# Patient Record
Sex: Female | Born: 1955
Health system: Southern US, Community
[De-identification: ages and names within clinical notes are randomized; demographics above are authoritative.]

## PROBLEM LIST (undated history)

## (undated) DIAGNOSIS — F329 Major depressive disorder, single episode, unspecified: Secondary | ICD-10-CM

## (undated) DIAGNOSIS — I10 Essential (primary) hypertension: Secondary | ICD-10-CM

## (undated) DIAGNOSIS — M1990 Unspecified osteoarthritis, unspecified site: Secondary | ICD-10-CM

## (undated) DIAGNOSIS — F32A Depression, unspecified: Secondary | ICD-10-CM

## (undated) HISTORY — PX: HAND SURGERY: SHX662

## (undated) HISTORY — PX: TUBAL LIGATION: SHX77

## (undated) HISTORY — PX: WISDOM TOOTH EXTRACTION: SHX21

## (undated) HISTORY — PX: MINI-LAPAROTOMY: SHX2035

## (undated) HISTORY — PX: TENDON RELEASE: SHX230

---

## 1997-11-23 ENCOUNTER — Emergency Department (HOSPITAL_COMMUNITY): Admission: EM | Admit: 1997-11-23 | Discharge: 1997-11-23 | Payer: Self-pay | Admitting: Emergency Medicine

## 2000-05-18 ENCOUNTER — Ambulatory Visit (HOSPITAL_COMMUNITY): Admission: RE | Admit: 2000-05-18 | Discharge: 2000-05-18 | Payer: Self-pay | Admitting: *Deleted

## 2001-06-23 ENCOUNTER — Emergency Department (HOSPITAL_COMMUNITY): Admission: EM | Admit: 2001-06-23 | Discharge: 2001-06-23 | Payer: Self-pay | Admitting: Emergency Medicine

## 2001-06-23 ENCOUNTER — Encounter: Payer: Self-pay | Admitting: Emergency Medicine

## 2001-07-03 ENCOUNTER — Encounter: Payer: Self-pay | Admitting: Orthopedic Surgery

## 2001-07-03 ENCOUNTER — Encounter: Admission: RE | Admit: 2001-07-03 | Discharge: 2001-07-03 | Payer: Self-pay | Admitting: Orthopedic Surgery

## 2002-03-13 ENCOUNTER — Encounter: Admission: RE | Admit: 2002-03-13 | Discharge: 2002-03-13 | Payer: Self-pay | Admitting: Family Medicine

## 2002-03-13 ENCOUNTER — Encounter: Payer: Self-pay | Admitting: Family Medicine

## 2002-09-26 ENCOUNTER — Encounter: Admission: RE | Admit: 2002-09-26 | Discharge: 2002-09-26 | Payer: Self-pay | Admitting: Family Medicine

## 2002-09-26 ENCOUNTER — Encounter: Payer: Self-pay | Admitting: Family Medicine

## 2002-10-01 ENCOUNTER — Ambulatory Visit (HOSPITAL_COMMUNITY): Admission: RE | Admit: 2002-10-01 | Discharge: 2002-10-01 | Payer: Self-pay | Admitting: Family Medicine

## 2002-10-01 ENCOUNTER — Encounter: Payer: Self-pay | Admitting: Family Medicine

## 2003-04-06 ENCOUNTER — Encounter: Payer: Self-pay | Admitting: Endocrinology

## 2003-04-06 ENCOUNTER — Ambulatory Visit (HOSPITAL_COMMUNITY): Admission: RE | Admit: 2003-04-06 | Discharge: 2003-04-06 | Payer: Self-pay | Admitting: Endocrinology

## 2003-04-08 ENCOUNTER — Emergency Department (HOSPITAL_COMMUNITY): Admission: EM | Admit: 2003-04-08 | Discharge: 2003-04-08 | Payer: Self-pay | Admitting: Emergency Medicine

## 2003-04-08 ENCOUNTER — Encounter: Payer: Self-pay | Admitting: Emergency Medicine

## 2003-06-03 ENCOUNTER — Emergency Department (HOSPITAL_COMMUNITY): Admission: AD | Admit: 2003-06-03 | Discharge: 2003-06-03 | Payer: Self-pay | Admitting: Family Medicine

## 2003-06-03 ENCOUNTER — Encounter: Payer: Self-pay | Admitting: Family Medicine

## 2004-02-27 ENCOUNTER — Emergency Department (HOSPITAL_COMMUNITY): Admission: EM | Admit: 2004-02-27 | Discharge: 2004-02-27 | Payer: Self-pay | Admitting: Family Medicine

## 2004-08-28 ENCOUNTER — Encounter: Admission: RE | Admit: 2004-08-28 | Discharge: 2004-08-28 | Payer: Self-pay | Admitting: Family Medicine

## 2004-09-02 ENCOUNTER — Ambulatory Visit (HOSPITAL_COMMUNITY): Admission: RE | Admit: 2004-09-02 | Discharge: 2004-09-02 | Payer: Self-pay | Admitting: Endocrinology

## 2005-08-16 ENCOUNTER — Ambulatory Visit (HOSPITAL_COMMUNITY): Admission: RE | Admit: 2005-08-16 | Discharge: 2005-08-16 | Payer: Self-pay | Admitting: Endocrinology

## 2005-09-10 ENCOUNTER — Encounter: Admission: RE | Admit: 2005-09-10 | Discharge: 2005-09-10 | Payer: Self-pay | Admitting: Family Medicine

## 2006-12-20 ENCOUNTER — Encounter: Admission: RE | Admit: 2006-12-20 | Discharge: 2006-12-20 | Payer: Self-pay | Admitting: Family Medicine

## 2008-03-18 ENCOUNTER — Emergency Department (HOSPITAL_COMMUNITY): Admission: EM | Admit: 2008-03-18 | Discharge: 2008-03-18 | Payer: Self-pay | Admitting: Emergency Medicine

## 2008-04-17 ENCOUNTER — Encounter: Admission: RE | Admit: 2008-04-17 | Discharge: 2008-04-17 | Payer: Self-pay | Admitting: Family Medicine

## 2008-06-12 ENCOUNTER — Ambulatory Visit (HOSPITAL_COMMUNITY): Admission: RE | Admit: 2008-06-12 | Discharge: 2008-06-12 | Payer: Self-pay | Admitting: Gastroenterology

## 2009-12-15 ENCOUNTER — Encounter: Admission: RE | Admit: 2009-12-15 | Discharge: 2009-12-15 | Payer: Self-pay | Admitting: Family Medicine

## 2010-09-21 ENCOUNTER — Other Ambulatory Visit: Payer: Self-pay | Admitting: Family Medicine

## 2010-09-21 DIAGNOSIS — Z780 Asymptomatic menopausal state: Secondary | ICD-10-CM

## 2010-09-21 DIAGNOSIS — Z1231 Encounter for screening mammogram for malignant neoplasm of breast: Secondary | ICD-10-CM

## 2010-12-17 ENCOUNTER — Ambulatory Visit
Admission: RE | Admit: 2010-12-17 | Discharge: 2010-12-17 | Disposition: A | Discharge status: Home / Self Care | Payer: Commercial Managed Care - PPO | Source: Ambulatory Visit | Attending: Family Medicine | Admitting: Family Medicine

## 2010-12-17 DIAGNOSIS — Z780 Asymptomatic menopausal state: Secondary | ICD-10-CM

## 2010-12-17 DIAGNOSIS — Z1231 Encounter for screening mammogram for malignant neoplasm of breast: Secondary | ICD-10-CM

## 2010-12-22 NOTE — Op Note (Signed)
Jeanne Brooks, Jeanne Brooks NO.:  [id number]   MEDICAL RECORD NO.:  [id number]          PATIENT TYPE:  AMB   LOCATION:  ENDO                         FACILITY:  [facility]   PHYSICIAN:  Anselmo Rod, M.D.  DATE OF BIRTH:  [birth year]   DATE OF PROCEDURE:  06/12/2008  DATE OF DISCHARGE:  06/12/2008                               OPERATIVE REPORT   PROCEDURE PERFORMED:  Screening colonoscopy.   ENDOSCOPIST:  Anselmo Rod, MD   INSTRUMENT USED:  Pentax video colonoscope.   INDICATIONS FOR PROCEDURE:  A 55 year old white female who underwent  screening colonoscopy to rule out colonic polyps, masses, etc.   PREPROCEDURE PREPARATION:  Informed consent was procured from the  patient.  The patient fasted for 8 hours prior to the procedure and  prepped with a bottle of magnesium citrate and a gallon of NuLYTELY the  night prior to the procedure.  The risks and benefits of the procedure  including a 10% miss rate of cancer and polyp were discussed with the  patient as well.   PREPROCEDURE PHYSICAL:  VITAL SIGNS:  Stable.  NECK:  Supple.  CHEST: Clear to auscultation. S1 and S2. Regular.  ABDOMEN:  Soft with normal bowel sounds.   DESCRIPTION OF PROCEDURE:  The patient was placed in left lateral  decubitus position, sedated with 100 mcg of Fentanyl and 10 mg of Versed  given intravenously in slow incremental doses along with 25 mg of  Benadryl intravenously.  Once the patient was adequately sedated and  maintained on low-flow oxygen and continuous cardiac monitoring, the  Pentax video colonoscope was advanced from the rectum to the cecum with  slight difficulty.  The patient had very redundant flow and the  patient's position was changed from the left lateral to the supine and  the right lateral position until the appendiceal orifice was reached.  The appendiceal orifice and ileocecal valve are visualized and  photographed.  No masses, polyps, erosions, ulcerations or  diverticula  were noted.  Retroflexion in the rectum revealed small internal  hemorrhoids.  The patient tolerated the procedure well without immediate  complications.   IMPRESSION:  1. Normal colonoscopy up to the cecum.  No masses, polyps, erosions,      ulceration or diverticula noted.  2. Small internal hemorrhoids seen on retroflexion.  3. Very redundant colon.   RECOMMENDATIONS:  1. A repeat colonoscopy has been advised in the next 10 years.  2. If the patient has any abnormal symptoms in the interim, she is to      contact the office immediately for further recommendations.  3. Outpatient followup if need arises in the future.      Anselmo Rod, M.D.  Electronically Signed     JNM/MEDQ  D:  06/14/2008  T:  06/15/2008  Job:  [id number]   cc:   Maryelizabeth Rowan, M.D.  Fax: [phone]

## 2010-12-25 NOTE — Op Note (Signed)
[facility]  Patient:    Jeanne Brooks, Jeanne Brooks                        MRN: [id number] Adm. Date:  [id number] Attending:  Donne Hazel                           Operative Report  PREOPERATIVE DIAGNOSIS:       Request permanent, voluntary sterilization.  POSTOPERATIVE DIAGNOSES:      1. Request permanent, voluntary sterilization.                               2. Large bowel Veress needle puncture-repaired.  PROCEDURE:                    1. Laparoscopy.                               2. Bilateral tubal ligation.                               3. Mini laparotomy with large bowel puncture                                  repair.  SURGEON:                      Willey Blade, M.D.  ASSISTANT:                    Mardene Celeste. Lurene Shadow, M.D.  ANESTHESIA:                   General endotracheal.  ESTIMATED BLOOD LOSS:         Less than 20 cc.  COMPLICATIONS:                At time of laparoscopy a Veress needle injury occurred at the beginning of the procedure.  This was noted in the descending colon.  Consultation with Dr. Leonie Man was undertaken intraoperatively and this was repaired by Dr. Lurene Shadow.  He performed a mini laparotomy with over sewing of the small puncture site.                                Otherwise, the pelvis was visualized and noted to be normal.  Bilateral tubal ligation was carried out with electrocautery.  PROCEDURE:                    The patient was taken to the operating room where a general endotracheal anesthetic was administered.  The patient was placed on the operating table in the dorsal lithotomy position.  The abdomen, perineum, and vagina were prepped and draped in the usual sterile fashion with Betadine and sterile drapes.  A Hulka tenaculum was introduced into the intrauterine cavity.  The bladder was emptied with a Red Rubber catheter. Next, a small vertical infraumbilical skin incision was made through which a Veress  needle was inserted into the abdominal cavity.  Excessive pressures were encountered with insufflation of carbon dioxide.  The Veress needle was  then flushed with 3 cc of normal saline and greenish material aspirated.  The Veress needle was then promptly removed and a disposable laparoscopic Trocar was then inserted atraumatically into the abdominal cavity with direct puncture.  The small puncture site of the descending colon was noted.  This did not spill any bowel contents and was only bleeding minimally. Intraoperative consult was then undertaken with Dr. Lurene Shadow.                                Procedure then continued and tubal ligation performed.  A pneumoperitoneum was created with carbon dioxide gas during the procedure.  Tubal ligation was carried out first on the left tube.  This was traced to its fimbriated end to ensure ______ identification.  The tube was then grasped approximately 2 cm away from the uterine fundus and burned thoroughly with three paddle widths of the bipolar Klepinger cautery.  This incorporated about 3 cm segment of tube.  The same procedure repeated upon the right tube.  This was done thoroughly and incorporated a portion of the mesosalpinx.  Good hemostasis was noted.  Dr. Lurene Shadow then arrived and repair the small puncture site of the colon was undertaken.                                A small vertical mini laparotomy was made by Dr. Lurene Shadow.  The small area of the bowel was then elevated into the incision and over sewed with a Lambert stitch of 2-0 silk.  This basically over sewed the small puncture site.  Next, the fascia and peritoneum were then closed in a running fashion with 0 PDS by Dr. Lurene Shadow.  The subcutaneous tissue was hemostatic.  The subcutaneous tissue was closed with multiple interrupted sutures of 0 Vicryl.  The skin reapproximated with a running subcuticular stitch of 3-0 Vicryl rapide.  A sterile dressing applied.  All vaginal instruments  were removed.  The patient was then awakened, extubated, and taken to the recovery room in stable condition.  There were no further complications. DD:  05/18/00 TD:  05/19/00 Job: [id number]

## 2011-08-06 ENCOUNTER — Emergency Department
Admission: EM | Admit: 2011-08-06 | Discharge: 2011-08-06 | Disposition: A | Discharge status: Home / Self Care | Payer: Commercial Managed Care - PPO | Source: Home / Self Care | Attending: Family Medicine | Admitting: Family Medicine

## 2011-08-06 ENCOUNTER — Encounter: Payer: Self-pay | Admitting: *Deleted

## 2011-08-06 DIAGNOSIS — J029 Acute pharyngitis, unspecified: Secondary | ICD-10-CM

## 2011-08-06 HISTORY — DX: Major depressive disorder, single episode, unspecified: F32.9

## 2011-08-06 HISTORY — DX: Essential (primary) hypertension: I10

## 2011-08-06 HISTORY — DX: Unspecified osteoarthritis, unspecified site: M19.90

## 2011-08-06 HISTORY — DX: Depression, unspecified: F32.A

## 2011-08-06 NOTE — ED Provider Notes (Signed)
History     CSN: [id number]  Arrival date & time 08/06/11  4098   First MD Initiated Contact with Patient 08/06/11 1108      Chief Complaint  Patient presents with  . Sore Throat  . Tinnitus     HPI Comments: Patient complains of 2 day history of sore throat, followed by progressive nasal congestion.  No cough.  Complains of fatigue but no myalgias.   There has been no pleuritic pain, shortness of breath, or wheezes.  She has had chills but no fever.  She has had a flu shot.  Patient is a 55 y.o. female presenting with pharyngitis. The history is provided by the patient.  Sore Throat This is a new problem. The current episode started 2 days ago. The problem occurs constantly. The problem has been gradually worsening. Pertinent negatives include no chest pain, no abdominal pain, no headaches and no shortness of breath. The symptoms are aggravated by swallowing. The symptoms are relieved by nothing.    Past Medical History  Diagnosis Date  . Hypertension   . Depression   . Arthritis     Past Surgical History  Procedure Date  . Hand surgery     LT hand  . Tendon release   . Mini-laparotomy     Family History  Problem Relation Age of Onset  . Cancer Mother     breast  . Coronary artery disease Father   . COPD Father     History  Substance Use Topics  . Smoking status: Never Smoker   . Smokeless tobacco: Not on file  . Alcohol Use: No    OB History    Grav Para Term Preterm Abortions TAB SAB Ect Mult Living                  Review of Systems  Respiratory: Negative for shortness of breath.   Cardiovascular: Negative for chest pain.  Gastrointestinal: Negative for abdominal pain.  Neurological: Negative for headaches.   + sore throat No cough No pleuritic pain No wheezing + nasal congestion No post-nasal drainage No sinus pain/pressure No itchy/red eyes No earache but ears feel clogged No hemoptysis No SOB No fever, + chills No nausea No  vomiting No abdominal pain No diarrhea No urinary symptoms No skin rashes + fatigue No myalgias No headache Used OTC meds without relief  Allergies  Augmentin and Codeine  Home Medications   Current Outpatient Rx  Name Route Sig Dispense Refill  . BUPROPION HCL 100 MG PO TABS Oral Take 100 mg by mouth 2 (two) times daily.      Marland Kitchen CALCIUM CARBONATE-VITAMIN D 600-200 MG-UNIT PO TABS Oral Take by mouth.      Marland Kitchen VITAMIN D 1000 UNITS PO TABS Oral Take 1,000 Units by mouth daily.      Marland Kitchen FLUOXETINE HCL 40 MG PO CAPS Oral Take 40 mg by mouth daily.      Marland Kitchen HYDROCHLOROTHIAZIDE 25 MG PO TABS Oral Take 25 mg by mouth daily.      Marland Kitchen LOSARTAN POTASSIUM 100 MG PO TABS Oral Take 100 mg by mouth daily.        BP 155/108  Pulse 95  Temp(Src) 98.4 F (36.9 C) (Oral)  Resp 20  Ht 5\' 6"  (1.676 m)  Wt 238 lb 8 oz (108.183 kg)  BMI 38.49 kg/m2  SpO2 94%  Physical Exam Nursing notes and Vital Signs reviewed. Appearance:  Patient appears healthy, stated age, and in no acute  distress.  Patient is obese (BMI 38.6)   Eyes:  Pupils are equal, round, and reactive to light and accomodation.  Extraocular movement is intact.  Conjunctivae are not inflamed  Ears:  Canals normal.  Tympanic membranes normal.  Nose:  Mildly congested turbinates.  No sinus tenderness.   Pharynx:  Normal Neck:  Supple.  No adenopathy Lungs:  Clear to auscultation.  Breath sounds are equal.  Heart:  Regular rate and rhythm without murmurs, rubs, or gallops.  Abdomen:  Nontender without masses or hepatosplenomegaly.  Bowel sounds are present.  No CVA or flank tenderness.  Skin:  No rash present.   ED Course  Procedures none   Labs Reviewed  POCT RAPID STREP A (OFFICE) negative      1. Acute pharyngitis       MDM  There is no evidence of bacterial infection today.  Suspect early viral URI Treat symptomatically for now: Recommend 2 Aleve tabs every 12 hours for sore throat.  May also take Tylenol If increased  sinus congestion develops take Mucinex (guaifenesin) twice daily for congestion.  Increase fluid intake, rest. May use Afrin nasal spray (or generic oxymetazoline) twice daily for about 5 days.  Also recommend using saline nasal spray several times daily and/or saline nasal irrigation. Stop all antihistamines for now, and other non-prescription cough/cold preparations. Follow-up with family doctor if not improving 7 to 10 days.         Donna Christen, MD 08/09/11 [phone]

## 2011-08-06 NOTE — ED Notes (Signed)
Pt c/o sore throat, chills and tinnitus x 2 days. She has taken vit c and nyquil. She received a flu vaccine.

## 2012-09-25 ENCOUNTER — Encounter: Payer: Self-pay | Admitting: *Deleted

## 2012-09-25 ENCOUNTER — Telehealth: Payer: Self-pay | Admitting: *Deleted

## 2012-09-25 ENCOUNTER — Emergency Department
Admission: EM | Admit: 2012-09-25 | Discharge: 2012-09-25 | Disposition: A | Discharge status: Home / Self Care | Payer: Commercial Managed Care - PPO | Source: Home / Self Care | Attending: Family Medicine | Admitting: Family Medicine

## 2012-09-25 ENCOUNTER — Emergency Department (INDEPENDENT_AMBULATORY_CARE_PROVIDER_SITE_OTHER): Payer: Commercial Managed Care - PPO

## 2012-09-25 DIAGNOSIS — M25572 Pain in left ankle and joints of left foot: Secondary | ICD-10-CM

## 2012-09-25 DIAGNOSIS — M25579 Pain in unspecified ankle and joints of unspecified foot: Secondary | ICD-10-CM

## 2012-09-25 DIAGNOSIS — M7989 Other specified soft tissue disorders: Secondary | ICD-10-CM

## 2012-09-25 LAB — D-DIMER, QUANTITATIVE: D-Dimer, Quant: 0.46 ug/mL-FEU (ref 0.00–0.48)

## 2012-09-25 NOTE — ED Notes (Signed)
Pt c/o LT ankle pain and swelling x 1 wk. Denies injury. She took IBF 800mg  at 0500.

## 2012-09-25 NOTE — ED Provider Notes (Signed)
History     CSN: [id number]  Arrival date & time 09/25/12  0825   First MD Initiated Contact with Patient 09/25/12 [phone]      Chief Complaint  Patient presents with  . Ankle Pain  . Joint Swelling   Patient is a 57 y.o. female presenting with ankle pain.  Ankle Pain   L ankle pain x 1 week.  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  Has had L ankle pain after shifts over the last week.  Pain mainly in L lateral ankle.  Mild swelling, especially after shifts.  No distal numbness or paresthesias.  No prior hx/o gout.  Pt is unsure of any direct ankle trauma.  Pain 4-5/10.  Has been taking ibuprofen for pain with mild improvement in sxs.    Past Medical History  Diagnosis Date  . Hypertension   . Depression   . Arthritis     Past Surgical History  Procedure Laterality Date  . Hand surgery      LT hand  . Tendon release    . Mini-laparotomy    . Tubal ligation    . Wisdom tooth extraction      Family History  Problem Relation Age of Onset  . Cancer Mother     breast  . Hypertension Mother   . Coronary artery disease Father   . COPD Father   . Hypertension Father     History  Substance Use Topics  . Smoking status: Never Smoker   . Smokeless tobacco: Not on file  . Alcohol Use: No    OB History   Grav Para Term Preterm Abortions TAB SAB Ect Mult Living                  Review of Systems  All other systems reviewed and are negative.    Allergies  Amoxicillin-pot clavulanate and Codeine  Home Medications   Current Outpatient Rx  Name  Route  Sig  Dispense  Refill  . amLODipine (NORVASC) 10 MG tablet   Oral   Take 10 mg by mouth daily.         . carvedilol (COREG) 12.5 MG tablet   Oral   Take 12.5 mg by mouth 2 (two) times daily with a meal.         . buPROPion (WELLBUTRIN) 100 MG tablet   Oral   Take 100 mg by mouth 2 (two) times daily.           . Calcium Carbonate-Vitamin D (CALCIUM + D) 600-200 MG-UNIT  TABS   Oral   Take by mouth.           . cholecalciferol (VITAMIN D) 1000 UNITS tablet   Oral   Take 1,000 Units by mouth daily.           Marland Kitchen FLUoxetine (PROZAC) 40 MG capsule   Oral   Take 40 mg by mouth daily.           . hydrochlorothiazide (HYDRODIURIL) 25 MG tablet   Oral   Take 25 mg by mouth daily.           Marland Kitchen losartan (COZAAR) 100 MG tablet   Oral   Take 100 mg by mouth daily.             BP 145/79  Pulse 84  Temp(Src) 97.9 F (36.6 C) (Oral)  Ht 5\' 6"  (1.676 m)  Wt 218 lb (98.884 kg)  BMI 35.2 kg/m2  SpO2 97%  Physical Exam  Constitutional: She appears well-developed.  HENT:  Head: Normocephalic and atraumatic.  Eyes: Conjunctivae are normal. Pupils are equal, round, and reactive to light.  Neck: Normal range of motion. Neck supple.  Cardiovascular: Normal rate and regular rhythm.   Pulmonary/Chest: Effort normal and breath sounds normal.  Abdominal: Soft.  Musculoskeletal: Normal range of motion.       Feet:  Calves symmetric  No popliteal tenderness Mild L sided ankle edema     Neurological: She is alert.  Skin: Skin is warm.    ED Course  Procedures (including critical care time)  Labs Reviewed - No data to display Dg Ankle Complete Left  09/25/2012  *RADIOLOGY REPORT*  Clinical Data: Lateral ankle pain and swelling for the past week.  LEFT ANKLE COMPLETE - 3+ VIEW  Comparison: No priors.  Findings: Three views of the left ankle demonstrate no acute displaced fracture, subluxation, dislocation, joint or soft tissue abnormality.  Mild degenerative changes of osteoarthritis are noted throughout the hindfoot.  IMPRESSION: 1.  No acute radiographic abnormality of the left ankle.   Original Report Authenticated By: Trudie Reed, M.D.      1. Ankle pain, left       MDM  Suspect likely ankle sprain.  Will brace.  RICE and NSAIDs.  Higher risk for gout given age, obesity, and pt being on thiazide diuretic. Will check uric acid  level, though this is lower on the differential diagnosis.  Wells score of 0-1. No classic signs of DVT (no popliteal tenderness, no calf asymmetry, though with some L ankle edema). Will check d-dimer.  Discussed general and MSK red flags.  Follow up with sports medicine if sxs not improved.      The patient and/or caregiver has been counseled thoroughly with regard to treatment plan and/or medications prescribed including dosage, schedule, interactions, rationale for use, and possible side effects and they verbalize understanding. Diagnoses and expected course of recovery discussed and will return if not improved as expected or if the condition worsens. Patient and/or caregiver verbalized understanding.             Doree Albee, MD 09/25/12 [phone]

## 2014-04-22 ENCOUNTER — Encounter (HOSPITAL_COMMUNITY): Payer: Self-pay | Admitting: Pharmacy Technician

## 2014-04-22 ENCOUNTER — Ambulatory Visit: Payer: Self-pay | Admitting: Orthopedic Surgery

## 2014-04-24 NOTE — Patient Instructions (Addendum)
Jeanne Brooks  04/24/2014   Your procedure is scheduled on:  05/02/2014    Report to [facility].  Follow the Signs to Short Stay Center at  0530      am  Call this number if you have problems the morning of surgery: [phone]   Remember:   Do not eat food or drink liquids after midnight.   Take these medicines the morning of surgery with A SIP OF WATER: amlodipine, wellbutrin, prozac    Do not wear jewelry, make-up or nail polish.  Do not wear lotions, powders, or perfumes.  deodorant.  Do not shave 48 hours prior to surgery.  Do not bring valuables to the hospital.  Contacts, dentures or bridgework may not be worn into surgery.  Leave suitcase in the car. After surgery it may be brought to your room.  For patients admitted to the hospital, checkout time is 11:00 AM the day of  discharge.     [place] - Preparing for Surgery Before surgery, you can play an important role.  Because skin is not sterile, your skin needs to be as free of germs as possible.  You can reduce the number of germs on your skin by washing with CHG (chlorahexidine gluconate) soap before surgery.  CHG is an antiseptic cleaner which kills germs and bonds with the skin to continue killing germs even after washing. Please DO NOT use if you have an allergy to CHG or antibacterial soaps.  If your skin becomes reddened/irritated stop using the CHG and inform your nurse when you arrive at Short Stay. Do not shave (including legs and underarms) for at least 48 hours prior to the first CHG shower.  You may shave your face/neck. Please follow these instructions carefully:  1.  Shower with CHG Soap the night before surgery and the  morning of Surgery.  2.  If you choose to wash your hair, wash your hair first as usual with your  normal  shampoo.  3.  After you shampoo, rinse your hair and body thoroughly to remove the  shampoo.                           4.  Use CHG as you would any other liquid soap.  You can  apply chg directly  to the skin and wash                       Gently with a scrungie or clean washcloth.  5.  Apply the CHG Soap to your body ONLY FROM THE NECK DOWN.   Do not use on face/ open                           Wound or open sores. Avoid contact with eyes, ears mouth and genitals (private parts).                       Wash face,  Genitals (private parts) with your normal soap.             6.  Wash thoroughly, paying special attention to the area where your surgery  will be performed.  7.  Thoroughly rinse your body with warm water from the neck down.  8.  DO NOT shower/wash with your normal soap after using and rinsing off  the CHG Soap.  9.  Pat yourself dry with a clean towel.            10.  Wear clean pajamas.            11.  Place clean sheets on your bed the night of your first shower and do not  sleep with pets. Day of Surgery : Do not apply any lotions/deodorants the morning of surgery.  Please wear clean clothes to the hospital/surgery center.  FAILURE TO FOLLOW THESE INSTRUCTIONS MAY RESULT IN THE CANCELLATION OF YOUR SURGERY PATIENT SIGNATURE_________________________________  NURSE SIGNATURE__________________________________  ________________________________________________________________________  WHAT IS A BLOOD TRANSFUSION? Blood Transfusion Information  A transfusion is the replacement of blood or some of its parts. Blood is made up of multiple cells which provide different functions.  Red blood cells carry oxygen and are used for blood loss replacement.  White blood cells fight against infection.  Platelets control bleeding.  Plasma helps clot blood.  Other blood products are available for specialized needs, such as hemophilia or other clotting disorders. BEFORE THE TRANSFUSION  Who gives blood for transfusions?   Healthy volunteers who are fully evaluated to make sure their blood is safe. This is blood bank blood. Transfusion therapy is  the safest it has ever been in the practice of medicine. Before blood is taken from a donor, a complete history is taken to make sure that person has no history of diseases nor engages in risky social behavior (examples are intravenous drug use or sexual activity with multiple partners). The donor's travel history is screened to minimize risk of transmitting infections, such as malaria. The donated blood is tested for signs of infectious diseases, such as HIV and hepatitis. The blood is then tested to be sure it is compatible with you in order to minimize the chance of a transfusion reaction. If you or a relative donates blood, this is often done in anticipation of surgery and is not appropriate for emergency situations. It takes many days to process the donated blood. RISKS AND COMPLICATIONS Although transfusion therapy is very safe and saves many lives, the main dangers of transfusion include:   Getting an infectious disease.  Developing a transfusion reaction. This is an allergic reaction to something in the blood you were given. Every precaution is taken to prevent this. The decision to have a blood transfusion has been considered carefully by your caregiver before blood is given. Blood is not given unless the benefits outweigh the risks. AFTER THE TRANSFUSION  Right after receiving a blood transfusion, you will usually feel much better and more energetic. This is especially true if your red blood cells have gotten low (anemic). The transfusion raises the level of the red blood cells which carry oxygen, and this usually causes an energy increase.  The nurse administering the transfusion will monitor you carefully for complications. HOME CARE INSTRUCTIONS  No special instructions are needed after a transfusion. You may find your energy is better. Speak with your caregiver about any limitations on activity for underlying diseases you may have. SEEK MEDICAL CARE IF:   Your condition is not  improving after your transfusion.  You develop redness or irritation at the intravenous (IV) site. SEEK IMMEDIATE MEDICAL CARE IF:  Any of the following symptoms occur over the next 12 hours:  Shaking chills.  You have a temperature by mouth above 102 F (38.9 C), not controlled by medicine.  Chest, back, or muscle pain.  People around you feel you are not acting correctly or  are confused.  Shortness of breath or difficulty breathing.  Dizziness and fainting.  You get a rash or develop hives.  You have a decrease in urine output.  Your urine turns a dark color or changes to pink, red, or brown. Any of the following symptoms occur over the next 10 days:  You have a temperature by mouth above 102 F (38.9 C), not controlled by medicine.  Shortness of breath.  Weakness after normal activity.  The white part of the eye turns yellow (jaundice).  You have a decrease in the amount of urine or are urinating less often.  Your urine turns a dark color or changes to pink, red, or brown. Document Released: 07/23/2000 Document Revised: 10/18/2011 Document Reviewed: 03/11/2008 ExitCare Patient Information 2014 [place].  _______________________________________________________________________  Incentive Spirometer  An incentive spirometer is a tool that can help keep your lungs clear and active. This tool measures how well you are filling your lungs with each breath. Taking long deep breaths may help reverse or decrease the chance of developing breathing (pulmonary) problems (especially infection) following:  A long period of time when you are unable to move or be active. BEFORE THE PROCEDURE   If the spirometer includes an indicator to show your best effort, your nurse or respiratory therapist will set it to a desired goal.  If possible, sit up straight or lean slightly forward. Try not to slouch.  Hold the incentive spirometer in an upright position. INSTRUCTIONS FOR  USE  1. Sit on the edge of your bed if possible, or sit up as far as you can in bed or on a chair. 2. Hold the incentive spirometer in an upright position. 3. Breathe out normally. 4. Place the mouthpiece in your mouth and seal your lips tightly around it. 5. Breathe in slowly and as deeply as possible, raising the piston or the ball toward the top of the column. 6. Hold your breath for 3-5 seconds or for as long as possible. Allow the piston or ball to fall to the bottom of the column. 7. Remove the mouthpiece from your mouth and breathe out normally. 8. Rest for a few seconds and repeat Steps 1 through 7 at least 10 times every 1-2 hours when you are awake. Take your time and take a few normal breaths between deep breaths. 9. The spirometer may include an indicator to show your best effort. Use the indicator as a goal to work toward during each repetition. 10. After each set of 10 deep breaths, practice coughing to be sure your lungs are clear. If you have an incision (the cut made at the time of surgery), support your incision when coughing by placing a pillow or rolled up towels firmly against it. Once you are able to get out of bed, walk around indoors and cough well. You may stop using the incentive spirometer when instructed by your caregiver.  RISKS AND COMPLICATIONS  Take your time so you do not get dizzy or light-headed.  If you are in pain, you may need to take or ask for pain medication before doing incentive spirometry. It is harder to take a deep breath if you are having pain. AFTER USE  Rest and breathe slowly and easily.  It can be helpful to keep track of a log of your progress. Your caregiver can provide you with a simple table to help with this. If you are using the spirometer at home, follow these instructions: [place] IF:  You are having difficultly using the spirometer.  You have trouble using the spirometer as often as instructed.  Your pain medication  is not giving enough relief while using the spirometer.  You develop fever of 100.5 F (38.1 C) or higher. SEEK IMMEDIATE MEDICAL CARE IF:   You cough up bloody sputum that had not been present before.  You develop fever of 102 F (38.9 C) or greater.  You develop worsening pain at or near the incision site. MAKE SURE YOU:   Understand these instructions.  Will watch your condition.  Will get help right away if you are not doing well or get worse. Document Released: 12/06/2006 Document Revised: 10/18/2011 Document Reviewed: 02/06/2007 ExitCare Patient Information 2014 ExitCare, [place].   ________________________________________________________________________    Please read over the following fact sheets that you were given: MRSA Information, coughing and deep breathing exercises, leg exercises

## 2014-04-25 ENCOUNTER — Encounter (HOSPITAL_COMMUNITY): Payer: Self-pay

## 2014-04-25 ENCOUNTER — Ambulatory Visit: Payer: Self-pay | Admitting: Orthopedic Surgery

## 2014-04-25 ENCOUNTER — Encounter (HOSPITAL_COMMUNITY)
Admission: RE | Admit: 2014-04-25 | Discharge: 2014-04-25 | Disposition: A | Discharge status: Home / Self Care | Payer: PRIVATE HEALTH INSURANCE | Source: Ambulatory Visit | Attending: Specialist | Admitting: Specialist

## 2014-04-25 ENCOUNTER — Ambulatory Visit (HOSPITAL_COMMUNITY)
Admission: RE | Admit: 2014-04-25 | Discharge: 2014-04-25 | Disposition: A | Discharge status: Home / Self Care | Payer: PRIVATE HEALTH INSURANCE | Source: Ambulatory Visit | Attending: Specialist | Admitting: Specialist

## 2014-04-25 ENCOUNTER — Ambulatory Visit (HOSPITAL_COMMUNITY)
Admission: RE | Admit: 2014-04-25 | Discharge: 2014-04-25 | Disposition: A | Discharge status: Home / Self Care | Payer: PRIVATE HEALTH INSURANCE | Source: Ambulatory Visit | Attending: Orthopedic Surgery | Admitting: Orthopedic Surgery

## 2014-04-25 DIAGNOSIS — I10 Essential (primary) hypertension: Secondary | ICD-10-CM | POA: Insufficient documentation

## 2014-04-25 DIAGNOSIS — Z01818 Encounter for other preprocedural examination: Secondary | ICD-10-CM | POA: Diagnosis present

## 2014-04-25 DIAGNOSIS — IMO0002 Reserved for concepts with insufficient information to code with codable children: Secondary | ICD-10-CM | POA: Insufficient documentation

## 2014-04-25 DIAGNOSIS — M1710 Unilateral primary osteoarthritis, unspecified knee: Secondary | ICD-10-CM | POA: Insufficient documentation

## 2014-04-25 LAB — BASIC METABOLIC PANEL
ANION GAP: 15 (ref 5–15)
BUN: 20 mg/dL (ref 6–23)
CALCIUM: 9.7 mg/dL (ref 8.4–10.5)
CO2: 22 mEq/L (ref 19–32)
Chloride: 100 mEq/L (ref 96–112)
Creatinine, Ser: 1.1 mg/dL (ref 0.50–1.10)
GFR, EST AFRICAN AMERICAN: 63 mL/min — AB (ref >90)
GFR, EST NON AFRICAN AMERICAN: 54 mL/min — AB (ref >90)
Glucose, Bld: 107 mg/dL — ABNORMAL HIGH (ref 70–99)
POTASSIUM: 3.8 meq/L (ref 3.7–5.3)
Sodium: 137 mEq/L (ref 137–147)

## 2014-04-25 LAB — CBC
HCT: 42.1 % (ref 36.0–46.0)
Hemoglobin: 14.5 g/dL (ref 12.0–15.0)
MCH: 28.2 pg (ref 26.0–34.0)
MCHC: 34.4 g/dL (ref 30.0–36.0)
MCV: 81.9 fL (ref 78.0–100.0)
PLATELETS: 281 10*3/uL (ref 150–400)
RBC: 5.14 MIL/uL — ABNORMAL HIGH (ref 3.87–5.11)
RDW: 12.8 % (ref 11.5–15.5)
WBC: 10.5 10*3/uL (ref 4.0–10.5)

## 2014-04-25 LAB — URINALYSIS, ROUTINE W REFLEX MICROSCOPIC
BILIRUBIN URINE: NEGATIVE
GLUCOSE, UA: NEGATIVE mg/dL
HGB URINE DIPSTICK: NEGATIVE
Ketones, ur: NEGATIVE mg/dL
Nitrite: NEGATIVE
PROTEIN: NEGATIVE mg/dL
Specific Gravity, Urine: 1.021 (ref 1.005–1.030)
Urobilinogen, UA: 0.2 mg/dL (ref 0.0–1.0)
pH: 5.5 (ref 5.0–8.0)

## 2014-04-25 LAB — URINE MICROSCOPIC-ADD ON

## 2014-04-25 LAB — SURGICAL PCR SCREEN
MRSA, PCR: NEGATIVE
Staphylococcus aureus: POSITIVE — AB

## 2014-04-25 LAB — APTT: aPTT: 40 seconds — ABNORMAL HIGH (ref 24–37)

## 2014-04-25 LAB — PROTIME-INR
INR: 1.09 (ref 0.00–1.49)
PROTHROMBIN TIME: 14.1 s (ref 11.6–15.2)

## 2014-04-25 LAB — ABO/RH: ABO/RH(D): O POS

## 2014-04-25 MED ORDER — DEXTROSE 5 % IV SOLN
900.0000 mg | INTRAVENOUS | Status: AC
  Administered 2014-05-02: 900 mg via INTRAVENOUS

## 2014-04-25 NOTE — Progress Notes (Signed)
Clearance note on chart .  LOV with Elizabeth Palau, NP- 04/18/2014 on chart  PFT - 04/18/2014 on chart  EKG- 04/18/14 on chart

## 2014-04-25 NOTE — Progress Notes (Signed)
PTT results faxed via EPIC to Dr Shelle Iron.

## 2014-04-25 NOTE — Progress Notes (Signed)
Urinalysis and micro results faxed via EPIC to Dr Shelle Iron.

## 2014-04-25 NOTE — Progress Notes (Signed)
Patient states she was instructed by Dr Shelle Iron to start hibiclens showers 7 days prior to surgery and to start Mupirocin ointment which he has already given her 7 days prior to surgery.

## 2014-04-26 ENCOUNTER — Ambulatory Visit: Payer: Self-pay | Admitting: Orthopedic Surgery

## 2014-04-26 NOTE — H&P (Signed)
Jeanne Brooks DOB: 07/23/1956 Married / Language: English / Race: White Female  H&P date: 04/25/14  Chief Complaint: Left knee pain  History of Present Illness The patient is a 57 year old female who comes in today for a preoperative history and physical. The patient is scheduled for a left total knee arthroplasty to be performed by Dr. Jeffrey C. Beane, MD at [facility] on May 02, 2014. Jeanne Brooks has been experiencing ongoing L knee pain refractory to steroid injections, bracing, quad strengthening, activity modifications, NSAIDs, relative rest, now interfering with ADL's.  Dr. Beane and the patient mutually agreed to proceed with a total knee replacement. Risks and benefits of the procedure were discussed including stiffness, suboptimal range of motion, persistent pain, infection requiring removal of prosthesis and reinsertion, need for prophylactic antibiotics in the future, for example, dental procedures, possible need for manipulation, revision in the future and also anesthetic complications including DVT, PE, etc. We discussed the perioperative course, time in the hospital, postoperative recovery and the need for elevation to control swelling. We also discussed the predicted range of motion and the probability that squatting and kneeling would be unobtainable in the future. In addition, postoperative anticoagulation was discussed. We have obtained preoperative medical clearance as necessary- Jeanne Anderson NP. Provided her illustrated handout and discussed it in detail. They will enroll in the total joint replacement educational forum at the hospital.  Allergies CODEINE12/01/2010 Nausea. EPINEPHRINE08/15/2007 Rapid pulse. AUGMENTIN08/15/2007 Vomiting. HYDROCODONE08/15/2007 Nausea.  Family History Heart Disease father Cancer First Degree Relatives. mother and father First Degree Relatives Diabetes Mellitus mother Cerebrovascular Accident grandfather fathers  side Hypertension mother and father Kidney disease grandmother fathers side Osteoarthritis father Other medical problems My mother was adopted unknown family hx for her mother /father  Social History  Tobacco use Never smoker. 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 Hydrochlorothiazide (12.5MG Capsule, Oral) Active. PROzac (Oral) Specific dose unknown - Active. Wellbutrin SR (Oral) Specific dose unknown - Active. Losartan Potassium (Oral) Specific dose unknown - Active. Norvasc (Oral) Specific dose unknown - Active. Aleve (220MG Tablet, Oral) Active. Zolpidem Tartrate (10MG Tablet, Oral) Active. Medications Reconciled  Past Surgical History Tubal Ligation with large bowel perforation requiring mini-laparotomy repair, 2000 Other Surgery trigger thumb release (Dr. Gramig) 2011; wisdom teeth extraction 1976  Past Medical Hx High blood pressure Anxiety Disorder Impaired Vision Depression Measles Rubella Menopause  Review of Systems General Not Present- Chills, Fatigue, Fever, Memory Loss, Night Sweats, Weight Gain and Weight Loss. Skin Not Present- Eczema, Hives, Itching, Lesions and Rash. HEENT Not Present- Dentures, Double Vision, Headache, Hearing Loss, Tinnitus and Visual Loss. Respiratory Not Present- Allergies, Chronic  Cough, Coughing up blood, Shortness of breath at rest and Shortness of breath with exertion. Cardiovascular Not Present- Chest Pain, Difficulty Breathing Lying Down, Murmur, Palpitations, Racing/skipping heartbeats and Swelling. Gastrointestinal Not Present- Abdominal Pain, Bloody Stool, Constipation, Diarrhea, Difficulty Swallowing, Heartburn, Jaundice, Loss of appetitie, Nausea and Vomiting. Female Genitourinary Present- Urinating   at Night. Not Present- Blood in Urine, Discharge, Flank Pain, Incontinence, Painful Urination, Urgency, Urinary frequency, Urinary Retention and Weak urinary stream. Musculoskeletal Present- Joint Pain, Joint Swelling and Morning Stiffness. Not Present- Back Pain, Muscle Pain, Muscle Weakness and Spasms. Neurological Not Present- Blackout spells, Difficulty with balance, Dizziness, Paralysis, Tremor and Weakness. Psychiatric Not Present- Insomnia.  Vitals Weight: 230 lb Height: 67in Body Surface Area: 2.22 m Body Mass Index: 36.02 kg/m  Physical Exam General Mental Status -Alert, cooperative and good historian. General Appearance-pleasant, Not in acute distress. Orientation-Oriented X3. Build & Nutrition-Overweight. Gait-Antalgic.  Head and Neck Head-normocephalic, atraumatic . Neck Global Assessment - supple, no bruit auscultated on the right, no bruit auscultated on the left.  Eye Pupil - Bilateral-Regular and Round. Motion - Bilateral-EOMI.  Chest and Lung Exam Auscultation Breath sounds - clear at anterior chest wall and clear at posterior chest wall. Adventitious sounds - No Adventitious sounds.  Cardiovascular Auscultation Rhythm - Regular rate and rhythm. Heart Sounds - S1 WNL and S2 WNL. Murmurs & Other Heart Sounds - Auscultation of the heart reveals - No Murmurs.  Abdomen Palpation/Percussion Tenderness - Abdomen is non-tender to palpation. Rigidity (guarding) - Abdomen is soft. Auscultation Auscultation of the  abdomen reveals - Bowel sounds normal.  Female Genitourinary Note: Not done, not pertinent to present illness  Musculoskeletal Note: Left Knee: Inspection and Palpation - Tenderness - medial joint line tender to palpation and lateral joint line tender to palpation, no tenderness to palpation of the superior calf, no tenderness to palpation of the pes anserine bursa, no tenderness to palpation of the quadriceps tendon, no tenderness to palpation of the patellar tendon, no tenderness to palpation of the patella, no tenderness to palpation of the fibular head, no tenderness to palpation of the peroneal nerve. Crepitus - mild patellofemoral crepitus. Patellar Tendon - no pain to palpation of the patellar tendon. Swelling - periarticular swelling present. Effusion - trace. Tissue tension/texture is - soft. Pulses - 2+. Sensation - intact to light touch. Skin - Color - no ecchymosis, no erythema. Strength and Tone - Quadriceps - 5/5. Hamstrings - 5/5. ROM: Flexion - AROM - 100 . Extension - AROM - 0 . Stability - Valgus Laxity at 30 - None. Valgus Laxity at 0 - None. Varus Laxity at 30 - None. Varus Laxity at 0 - None. Lachman - Negative. Anterior Drawer Test - Negative. Posterior Drawer Test - Negative. Left Knee - Deformities/Malalignments/Discrepancies - no deformities noted. Special Tests - McMurray Test (lateral) - negative. McMurray Test (medial) - negative. Patellar Compression Pain - moderate pain.  Imaging Note:prior standing xrays from 12/2013 reviewed. Left knee with severe medial and PF narrowing, bone-on-bone end-stage changes with peri-articular osteophytes, varus deformity, collapse of the medial joint space. Right knee with moderate medial narrowing and moderately severe PF narrowing.  Assessment & Plan  DJD Left Knee  Pt with ongoing L knee pain due to DJD, interfering with ADL's, refractory to conservative tx including steroid injections, quad strengthening, HEP, activity  modifications, NSAIDs, relative rest, bracing. She is scheduled for left total knee replacement by Dr. Beane on 05/02/14. Discussed the procedure itself as well as risks, complications and alternatives, including but not limited to DVT, PE, infx, bleeding, failure of procedure, need for secondary procedure including manipulation, nerve injury, ongoing pain/symptoms, anesthesia risk, even stroke or death. Also discussed typical post-op protocols, activity restrictions, need for PT, flexion/extension exercises, time out of work. Discussed need for DVT ppx post-op with Xarelto then ASA per   protocol. Discussed dental ppx. Also discussed limitations post-operatively such as kneeling and squatting. All questions were answered. Patient desires to proceed with surgery as scheduled. She has been cleared by her PCP. She has received Rx's for MRSA decolonization. Hospital labs received after her appt today, tested positive for staph aureus but negative for MRSA, will add clinda with her kefzol for appropriate coverage. Plan Percocet for pain with Robaxin, Colace, Xarelto for DVT ppx. Plan home with HHPT post-op. She will follow up 10-14 days post op for suture removal and xrays and will call with any questions or concerns in the interim.  Signed electronically by Gerene Nedd, PA-C for Dr. Beane    

## 2014-05-01 NOTE — Anesthesia Preprocedure Evaluation (Addendum)
Anesthesia Evaluation  Patient identified by MRN, date of birth, ID band Patient awake    Reviewed: Allergy & Precautions, H&P , NPO status , Patient's Chart, lab work & pertinent test results  Airway Mallampati: III TM Distance: <3 FB Neck ROM: Full    Dental no notable dental hx.    Pulmonary neg pulmonary ROS,  breath sounds clear to auscultation  Pulmonary exam normal       Cardiovascular hypertension, Pt. on medications Rhythm:Regular Rate:Normal     Neuro/Psych negative neurological ROS  negative psych ROS   GI/Hepatic negative GI ROS, Neg liver ROS,   Endo/Other  Obesity   Renal/GU negative Renal ROS  negative genitourinary   Musculoskeletal negative musculoskeletal ROS (+)   Abdominal   Peds negative pediatric ROS (+)  Hematology negative hematology ROS (+)   Anesthesia Other Findings   Reproductive/Obstetrics negative OB ROS                         Anesthesia Physical Anesthesia Plan  ASA: II  Anesthesia Plan: General   Post-op Pain Management:    Induction: Intravenous  Airway Management Planned: Oral ETT  Additional Equipment:   Intra-op Plan:   Post-operative Plan: Extubation in OR  Informed Consent: I have reviewed the patients History and Physical, chart, labs and discussed the procedure including the risks, benefits and alternatives for the proposed anesthesia with the patient or authorized representative who has indicated his/her understanding and acceptance.   Dental advisory given  Plan Discussed with: CRNA and Surgeon  Anesthesia Plan Comments:         Anesthesia Quick Evaluation

## 2014-05-02 ENCOUNTER — Inpatient Hospital Stay (HOSPITAL_COMMUNITY): Payer: PRIVATE HEALTH INSURANCE | Admitting: Anesthesiology

## 2014-05-02 ENCOUNTER — Encounter (HOSPITAL_COMMUNITY): Payer: Self-pay | Admitting: *Deleted

## 2014-05-02 ENCOUNTER — Encounter (HOSPITAL_COMMUNITY)
Admission: RE | Disposition: A | Discharge status: Home Health | Payer: Self-pay | Source: Ambulatory Visit | Attending: Specialist

## 2014-05-02 ENCOUNTER — Inpatient Hospital Stay (HOSPITAL_COMMUNITY): Payer: PRIVATE HEALTH INSURANCE

## 2014-05-02 ENCOUNTER — Inpatient Hospital Stay (HOSPITAL_COMMUNITY)
Admission: RE | Admit: 2014-05-02 | Discharge: 2014-05-05 | DRG: 470 | Disposition: A | Discharge status: Home Health | Payer: PRIVATE HEALTH INSURANCE | Source: Ambulatory Visit | Attending: Specialist | Admitting: Specialist

## 2014-05-02 ENCOUNTER — Encounter (HOSPITAL_COMMUNITY): Payer: PRIVATE HEALTH INSURANCE | Admitting: Anesthesiology

## 2014-05-02 DIAGNOSIS — Z8249 Family history of ischemic heart disease and other diseases of the circulatory system: Secondary | ICD-10-CM

## 2014-05-02 DIAGNOSIS — Z881 Allergy status to other antibiotic agents status: Secondary | ICD-10-CM

## 2014-05-02 DIAGNOSIS — Z823 Family history of stroke: Secondary | ICD-10-CM | POA: Diagnosis not present

## 2014-05-02 DIAGNOSIS — H547 Unspecified visual loss: Secondary | ICD-10-CM | POA: Diagnosis present

## 2014-05-02 DIAGNOSIS — Z888 Allergy status to other drugs, medicaments and biological substances status: Secondary | ICD-10-CM | POA: Diagnosis not present

## 2014-05-02 DIAGNOSIS — M898X9 Other specified disorders of bone, unspecified site: Secondary | ICD-10-CM | POA: Diagnosis present

## 2014-05-02 DIAGNOSIS — F329 Major depressive disorder, single episode, unspecified: Secondary | ICD-10-CM | POA: Diagnosis present

## 2014-05-02 DIAGNOSIS — Z79899 Other long term (current) drug therapy: Secondary | ICD-10-CM | POA: Diagnosis not present

## 2014-05-02 DIAGNOSIS — M24569 Contracture, unspecified knee: Secondary | ICD-10-CM | POA: Diagnosis present

## 2014-05-02 DIAGNOSIS — Z885 Allergy status to narcotic agent status: Secondary | ICD-10-CM

## 2014-05-02 DIAGNOSIS — Z833 Family history of diabetes mellitus: Secondary | ICD-10-CM

## 2014-05-02 DIAGNOSIS — M1712 Unilateral primary osteoarthritis, left knee: Secondary | ICD-10-CM | POA: Diagnosis present

## 2014-05-02 DIAGNOSIS — F3289 Other specified depressive episodes: Secondary | ICD-10-CM | POA: Diagnosis present

## 2014-05-02 DIAGNOSIS — M25569 Pain in unspecified knee: Secondary | ICD-10-CM | POA: Diagnosis present

## 2014-05-02 DIAGNOSIS — Z6836 Body mass index (BMI) 36.0-36.9, adult: Secondary | ICD-10-CM

## 2014-05-02 DIAGNOSIS — M1710 Unilateral primary osteoarthritis, unspecified knee: Principal | ICD-10-CM | POA: Diagnosis present

## 2014-05-02 DIAGNOSIS — E669 Obesity, unspecified: Secondary | ICD-10-CM | POA: Diagnosis present

## 2014-05-02 DIAGNOSIS — I10 Essential (primary) hypertension: Secondary | ICD-10-CM | POA: Diagnosis present

## 2014-05-02 DIAGNOSIS — F411 Generalized anxiety disorder: Secondary | ICD-10-CM | POA: Diagnosis present

## 2014-05-02 HISTORY — PX: TOTAL KNEE ARTHROPLASTY: SHX125

## 2014-05-02 LAB — TYPE AND SCREEN
ABO/RH(D): O POS
Antibody Screen: NEGATIVE

## 2014-05-02 SURGERY — ARTHROPLASTY, KNEE, TOTAL
Anesthesia: General | Site: Knee | Laterality: Left

## 2014-05-02 MED ORDER — AMLODIPINE BESYLATE 5 MG PO TABS
5.0000 mg | ORAL_TABLET | Freq: Every morning | ORAL | Status: DC
  Administered 2014-05-03 – 2014-05-04 (×2): 5 mg via ORAL
  Filled 2014-05-02 (×3): qty 1

## 2014-05-02 MED ORDER — HYDROMORPHONE HCL 2 MG/ML IJ SOLN
INTRAMUSCULAR | Status: AC
  Filled 2014-05-02: qty 1

## 2014-05-02 MED ORDER — DEXAMETHASONE SODIUM PHOSPHATE 10 MG/ML IJ SOLN
INTRAMUSCULAR | Status: AC
  Filled 2014-05-02: qty 1

## 2014-05-02 MED ORDER — OXYCODONE HCL 5 MG PO TABS
5.0000 mg | ORAL_TABLET | ORAL | Status: DC | PRN
  Administered 2014-05-02 (×2): 5 mg via ORAL
  Administered 2014-05-03 (×2): 10 mg via ORAL
  Administered 2014-05-03: 5 mg via ORAL
  Filled 2014-05-02: qty 1
  Filled 2014-05-02 (×2): qty 2
  Filled 2014-05-02 (×2): qty 1

## 2014-05-02 MED ORDER — HYDROMORPHONE HCL 1 MG/ML IJ SOLN
INTRAMUSCULAR | Status: AC
  Filled 2014-05-02: qty 1

## 2014-05-02 MED ORDER — MIDAZOLAM HCL 2 MG/2ML IJ SOLN
INTRAMUSCULAR | Status: AC
  Filled 2014-05-02: qty 2

## 2014-05-02 MED ORDER — CEFAZOLIN SODIUM-DEXTROSE 2-3 GM-% IV SOLR
2.0000 g | INTRAVENOUS | Status: AC
  Administered 2014-05-02: 2 g via INTRAVENOUS

## 2014-05-02 MED ORDER — HYDROCHLOROTHIAZIDE 25 MG PO TABS
25.0000 mg | ORAL_TABLET | Freq: Every morning | ORAL | Status: DC
  Administered 2014-05-03 – 2014-05-04 (×2): 25 mg via ORAL
  Filled 2014-05-02 (×4): qty 1

## 2014-05-02 MED ORDER — BUPIVACAINE-EPINEPHRINE (PF) 0.25% -1:200000 IJ SOLN
INTRAMUSCULAR | Status: AC
  Filled 2014-05-02: qty 30

## 2014-05-02 MED ORDER — ONDANSETRON HCL 4 MG/2ML IJ SOLN
INTRAMUSCULAR | Status: DC | PRN
  Administered 2014-05-02: 4 mg via INTRAVENOUS

## 2014-05-02 MED ORDER — LIDOCAINE HCL (CARDIAC) 20 MG/ML IV SOLN
INTRAVENOUS | Status: DC | PRN
  Administered 2014-05-02: 100 mg via INTRAVENOUS

## 2014-05-02 MED ORDER — FLEET ENEMA 7-19 GM/118ML RE ENEM
1.0000 | ENEMA | Freq: Once | RECTAL | Status: AC | PRN
Start: 2014-05-02 — End: 2014-05-02

## 2014-05-02 MED ORDER — NEOSTIGMINE METHYLSULFATE 10 MG/10ML IV SOLN
INTRAVENOUS | Status: AC
  Filled 2014-05-02: qty 1

## 2014-05-02 MED ORDER — ACETAMINOPHEN 325 MG PO TABS
650.0000 mg | ORAL_TABLET | Freq: Four times a day (QID) | ORAL | Status: DC | PRN
  Administered 2014-05-05: 325 mg via ORAL
  Filled 2014-05-02: qty 2

## 2014-05-02 MED ORDER — PROPOFOL 10 MG/ML IV BOLUS
INTRAVENOUS | Status: AC
  Filled 2014-05-02: qty 20

## 2014-05-02 MED ORDER — PHENOL 1.4 % MT LIQD: 1.0000 | OROMUCOSAL | Status: DC | PRN

## 2014-05-02 MED ORDER — METHOCARBAMOL 500 MG PO TABS: 500.0000 mg | ORAL_TABLET | Freq: Three times a day (TID) | ORAL | Status: DC | PRN

## 2014-05-02 MED ORDER — METHOCARBAMOL 1000 MG/10ML IJ SOLN
500.0000 mg | Freq: Four times a day (QID) | INTRAVENOUS | Status: DC | PRN
  Administered 2014-05-02: 500 mg via INTRAVENOUS
  Filled 2014-05-02: qty 5

## 2014-05-02 MED ORDER — FENTANYL CITRATE 0.05 MG/ML IJ SOLN
INTRAMUSCULAR | Status: DC | PRN
  Administered 2014-05-02 (×5): 50 ug via INTRAVENOUS

## 2014-05-02 MED ORDER — ROCURONIUM BROMIDE 100 MG/10ML IV SOLN
INTRAVENOUS | Status: AC
  Filled 2014-05-02: qty 1

## 2014-05-02 MED ORDER — LACTATED RINGERS IV SOLN
INTRAVENOUS | Status: DC
  Administered 2014-05-02: 07:00:00 via INTRAVENOUS

## 2014-05-02 MED ORDER — BISACODYL 5 MG PO TBEC: 5.0000 mg | DELAYED_RELEASE_TABLET | Freq: Every day | ORAL | Status: DC | PRN

## 2014-05-02 MED ORDER — HYDROMORPHONE HCL 1 MG/ML IJ SOLN
INTRAMUSCULAR | Status: DC | PRN
  Administered 2014-05-02: 0.5 mg via INTRAVENOUS
  Administered 2014-05-02: 1 mg via INTRAVENOUS
  Administered 2014-05-02: 0.5 mg via INTRAVENOUS

## 2014-05-02 MED ORDER — MENTHOL 3 MG MT LOZG
1.0000 | LOZENGE | OROMUCOSAL | Status: DC | PRN
Start: 2014-05-02 — End: 2014-05-05
  Administered 2014-05-03: 3 mg via ORAL

## 2014-05-02 MED ORDER — DOCUSATE SODIUM 100 MG PO CAPS: 100.0000 mg | ORAL_CAPSULE | Freq: Two times a day (BID) | ORAL | Status: DC | PRN

## 2014-05-02 MED ORDER — EPHEDRINE SULFATE 50 MG/ML IJ SOLN
INTRAMUSCULAR | Status: AC
  Filled 2014-05-02: qty 1

## 2014-05-02 MED ORDER — BUPROPION HCL ER (XL) 300 MG PO TB24
300.0000 mg | ORAL_TABLET | Freq: Every morning | ORAL | Status: DC
  Administered 2014-05-02 – 2014-05-04 (×3): 300 mg via ORAL
  Filled 2014-05-02 (×4): qty 1

## 2014-05-02 MED ORDER — METOCLOPRAMIDE HCL 10 MG PO TABS: 5.0000 mg | ORAL_TABLET | Freq: Three times a day (TID) | ORAL | Status: DC | PRN

## 2014-05-02 MED ORDER — EPHEDRINE SULFATE 50 MG/ML IJ SOLN
INTRAMUSCULAR | Status: DC | PRN
  Administered 2014-05-02 (×2): 5 mg via INTRAVENOUS

## 2014-05-02 MED ORDER — SODIUM CHLORIDE 0.9 % IJ SOLN
INTRAMUSCULAR | Status: AC
  Filled 2014-05-02: qty 50

## 2014-05-02 MED ORDER — FLUOXETINE HCL 20 MG PO CAPS
40.0000 mg | ORAL_CAPSULE | Freq: Every morning | ORAL | Status: DC
  Administered 2014-05-02 – 2014-05-04 (×3): 40 mg via ORAL
  Filled 2014-05-02 (×4): qty 2

## 2014-05-02 MED ORDER — ONDANSETRON HCL 4 MG PO TABS: 4.0000 mg | ORAL_TABLET | Freq: Four times a day (QID) | ORAL | Status: DC | PRN

## 2014-05-02 MED ORDER — HYDROMORPHONE HCL 1 MG/ML IJ SOLN
1.0000 mg | INTRAMUSCULAR | Status: DC | PRN
  Administered 2014-05-03 (×4): 1 mg via INTRAVENOUS
  Filled 2014-05-02 (×4): qty 1

## 2014-05-02 MED ORDER — SODIUM CHLORIDE 0.9 % IR SOLN
Status: DC | PRN
  Administered 2014-05-02: 08:00:00

## 2014-05-02 MED ORDER — PROPOFOL 10 MG/ML IV BOLUS
INTRAVENOUS | Status: DC | PRN
  Administered 2014-05-02: 200 mg via INTRAVENOUS

## 2014-05-02 MED ORDER — METHOCARBAMOL 500 MG PO TABS
500.0000 mg | ORAL_TABLET | Freq: Four times a day (QID) | ORAL | Status: DC | PRN
  Administered 2014-05-02 – 2014-05-05 (×8): 500 mg via ORAL
  Filled 2014-05-02 (×8): qty 1

## 2014-05-02 MED ORDER — BUPIVACAINE LIPOSOME 1.3 % IJ SUSP
INTRAMUSCULAR | Status: DC | PRN
  Administered 2014-05-02: 10 mL

## 2014-05-02 MED ORDER — ONDANSETRON HCL 4 MG/2ML IJ SOLN
INTRAMUSCULAR | Status: AC
  Filled 2014-05-02: qty 2

## 2014-05-02 MED ORDER — STERILE WATER FOR IRRIGATION IR SOLN
Status: DC | PRN
  Administered 2014-05-02: 1500 mL

## 2014-05-02 MED ORDER — METOCLOPRAMIDE HCL 5 MG/ML IJ SOLN: 5.0000 mg | Freq: Three times a day (TID) | INTRAMUSCULAR | Status: DC | PRN

## 2014-05-02 MED ORDER — SENNOSIDES-DOCUSATE SODIUM 8.6-50 MG PO TABS: 1.0000 | ORAL_TABLET | Freq: Every evening | ORAL | Status: DC | PRN

## 2014-05-02 MED ORDER — BUPIVACAINE LIPOSOME 1.3 % IJ SUSP
20.0000 mL | Freq: Once | INTRAMUSCULAR | Status: DC
  Filled 2014-05-02: qty 20

## 2014-05-02 MED ORDER — DEXAMETHASONE SODIUM PHOSPHATE 10 MG/ML IJ SOLN
INTRAMUSCULAR | Status: DC | PRN
  Administered 2014-05-02: 10 mg via INTRAVENOUS

## 2014-05-02 MED ORDER — ACETAMINOPHEN 650 MG RE SUPP: 650.0000 mg | Freq: Four times a day (QID) | RECTAL | Status: DC | PRN

## 2014-05-02 MED ORDER — ONDANSETRON HCL 4 MG/2ML IJ SOLN: 4.0000 mg | Freq: Four times a day (QID) | INTRAMUSCULAR | Status: DC | PRN

## 2014-05-02 MED ORDER — POLYMYXIN B SULFATE 500000 UNITS IJ SOLR
INTRAMUSCULAR | Status: AC
  Filled 2014-05-02: qty 1

## 2014-05-02 MED ORDER — ZOLPIDEM TARTRATE 5 MG PO TABS
5.0000 mg | ORAL_TABLET | Freq: Every evening | ORAL | Status: DC | PRN
  Administered 2014-05-02 – 2014-05-04 (×3): 5 mg via ORAL
  Filled 2014-05-02 (×3): qty 1

## 2014-05-02 MED ORDER — HYDROMORPHONE HCL 1 MG/ML IJ SOLN
0.2500 mg | INTRAMUSCULAR | Status: DC | PRN
  Administered 2014-05-02: 0.25 mg via INTRAVENOUS

## 2014-05-02 MED ORDER — MIDAZOLAM HCL 5 MG/5ML IJ SOLN
INTRAMUSCULAR | Status: DC | PRN
  Administered 2014-05-02: 2 mg via INTRAVENOUS

## 2014-05-02 MED ORDER — ALUM & MAG HYDROXIDE-SIMETH 200-200-20 MG/5ML PO SUSP: 30.0000 mL | ORAL | Status: DC | PRN

## 2014-05-02 MED ORDER — GLYCOPYRROLATE 0.2 MG/ML IJ SOLN
INTRAMUSCULAR | Status: AC
  Filled 2014-05-02: qty 4

## 2014-05-02 MED ORDER — CEFAZOLIN SODIUM-DEXTROSE 2-3 GM-% IV SOLR
2.0000 g | Freq: Four times a day (QID) | INTRAVENOUS | Status: AC
  Administered 2014-05-02 (×2): 2 g via INTRAVENOUS
  Filled 2014-05-02 (×2): qty 50

## 2014-05-02 MED ORDER — ACETAMINOPHEN 10 MG/ML IV SOLN
1000.0000 mg | Freq: Once | INTRAVENOUS | Status: AC
  Administered 2014-05-02: 1000 mg via INTRAVENOUS
  Filled 2014-05-02: qty 100

## 2014-05-02 MED ORDER — GLYCOPYRROLATE 0.2 MG/ML IJ SOLN
INTRAMUSCULAR | Status: DC | PRN
  Administered 2014-05-02: .8 mg via INTRAVENOUS

## 2014-05-02 MED ORDER — LIDOCAINE HCL (CARDIAC) 20 MG/ML IV SOLN
INTRAVENOUS | Status: AC
  Filled 2014-05-02: qty 5

## 2014-05-02 MED ORDER — NEOSTIGMINE METHYLSULFATE 10 MG/10ML IV SOLN
INTRAVENOUS | Status: DC | PRN
  Administered 2014-05-02: 5 mg via INTRAVENOUS

## 2014-05-02 MED ORDER — ATROPINE SULFATE 0.4 MG/ML IJ SOLN
INTRAMUSCULAR | Status: AC
  Filled 2014-05-02: qty 2

## 2014-05-02 MED ORDER — CEFAZOLIN SODIUM-DEXTROSE 2-3 GM-% IV SOLR
INTRAVENOUS | Status: AC
  Filled 2014-05-02: qty 50

## 2014-05-02 MED ORDER — PROMETHAZINE HCL 25 MG/ML IJ SOLN: 6.2500 mg | INTRAMUSCULAR | Status: DC | PRN

## 2014-05-02 MED ORDER — OXYCODONE-ACETAMINOPHEN 7.5-325 MG PO TABS: 1.0000 | ORAL_TABLET | ORAL | Status: DC | PRN

## 2014-05-02 MED ORDER — RIVAROXABAN 10 MG PO TABS
10.0000 mg | ORAL_TABLET | Freq: Every day | ORAL | Status: DC
  Administered 2014-05-03 – 2014-05-05 (×3): 10 mg via ORAL
  Filled 2014-05-02 (×5): qty 1

## 2014-05-02 MED ORDER — LOSARTAN POTASSIUM 50 MG PO TABS
100.0000 mg | ORAL_TABLET | Freq: Every morning | ORAL | Status: DC
  Administered 2014-05-03 – 2014-05-04 (×2): 100 mg via ORAL
  Filled 2014-05-02 (×4): qty 2

## 2014-05-02 MED ORDER — FENTANYL CITRATE 0.05 MG/ML IJ SOLN
INTRAMUSCULAR | Status: AC
  Filled 2014-05-02: qty 5

## 2014-05-02 MED ORDER — RIVAROXABAN 10 MG PO TABS: 10.0000 mg | ORAL_TABLET | Freq: Every day | ORAL | Status: DC

## 2014-05-02 MED ORDER — BUPIVACAINE-EPINEPHRINE 0.25% -1:200000 IJ SOLN
INTRAMUSCULAR | Status: DC | PRN
  Administered 2014-05-02: 10 mL

## 2014-05-02 MED ORDER — ROCURONIUM BROMIDE 100 MG/10ML IV SOLN
INTRAVENOUS | Status: DC | PRN
  Administered 2014-05-02: 50 mg via INTRAVENOUS

## 2014-05-02 MED ORDER — SODIUM CHLORIDE 0.45 % IV SOLN
INTRAVENOUS | Status: AC
  Administered 2014-05-02: 15:00:00 via INTRAVENOUS

## 2014-05-02 MED ORDER — BUPIVACAINE-EPINEPHRINE (PF) 0.5% -1:200000 IJ SOLN
INTRAMUSCULAR | Status: AC
  Filled 2014-05-02: qty 30

## 2014-05-02 MED ORDER — CLINDAMYCIN PHOSPHATE 900 MG/50ML IV SOLN
INTRAVENOUS | Status: AC
  Filled 2014-05-02: qty 50

## 2014-05-02 MED ORDER — DIPHENHYDRAMINE HCL 12.5 MG/5ML PO ELIX: 12.5000 mg | ORAL_SOLUTION | ORAL | Status: DC | PRN

## 2014-05-02 MED ORDER — DOCUSATE SODIUM 100 MG PO CAPS
100.0000 mg | ORAL_CAPSULE | Freq: Two times a day (BID) | ORAL | Status: DC
  Administered 2014-05-02 – 2014-05-04 (×5): 100 mg via ORAL

## 2014-05-02 MED ORDER — SODIUM CHLORIDE 0.9 % IJ SOLN
INTRAMUSCULAR | Status: AC
Start: 2014-05-02 — End: 2014-05-02
  Filled 2014-05-02: qty 10

## 2014-05-02 SURGICAL SUPPLY — 69 items
BAG ZIPLOCK 12X15 (MISCELLANEOUS) IMPLANT
BANDAGE ELASTIC 4 VELCRO ST LF (GAUZE/BANDAGES/DRESSINGS) ×2 IMPLANT
BANDAGE ELASTIC 6 VELCRO ST LF (GAUZE/BANDAGES/DRESSINGS) ×2 IMPLANT
BANDAGE ESMARK 6X9 LF (GAUZE/BANDAGES/DRESSINGS) ×1 IMPLANT
BLADE SAG 18X100X1.27 (BLADE) ×2 IMPLANT
BLADE SAW SGTL 13.0X1.19X90.0M (BLADE) ×2 IMPLANT
BNDG ESMARK 6X9 LF (GAUZE/BANDAGES/DRESSINGS) ×2
CAPT RP KNEE ×2 IMPLANT
CEMENT HV SMART SET (Cement) ×4 IMPLANT
CHLORAPREP W/TINT 26ML (MISCELLANEOUS) IMPLANT
CLOTH 2% CHLOROHEXIDINE 3PK (PERSONAL CARE ITEMS) ×2 IMPLANT
CUFF TOURN SGL QUICK 34 (TOURNIQUET CUFF) ×1
CUFF TRNQT CYL 34X4X40X1 (TOURNIQUET CUFF) ×1 IMPLANT
DERMABOND ADVANCED (GAUZE/BANDAGES/DRESSINGS) ×1
DERMABOND ADVANCED .7 DNX12 (GAUZE/BANDAGES/DRESSINGS) ×1 IMPLANT
DRAPE INCISE IOBAN 66X45 STRL (DRAPES) IMPLANT
DRAPE POUCH INSTRU U-SHP 10X18 (DRAPES) ×2 IMPLANT
DRAPE SHEET LG 3/4 BI-LAMINATE (DRAPES) ×2 IMPLANT
DRAPE SPLIT 77X100IN (DRAPES) ×4 IMPLANT
DRAPE U-SHAPE 47X51 STRL (DRAPES) ×2 IMPLANT
DRSG ADAPTIC 3X8 NADH LF (GAUZE/BANDAGES/DRESSINGS) IMPLANT
DRSG AQUACEL AG ADV 3.5X10 (GAUZE/BANDAGES/DRESSINGS) ×2 IMPLANT
DRSG PAD ABDOMINAL 8X10 ST (GAUZE/BANDAGES/DRESSINGS) IMPLANT
DRSG TEGADERM 4X4.75 (GAUZE/BANDAGES/DRESSINGS) ×2 IMPLANT
DURAPREP 26ML APPLICATOR (WOUND CARE) ×2 IMPLANT
ELECT REM PT RETURN 9FT ADLT (ELECTROSURGICAL) ×2
ELECTRODE REM PT RTRN 9FT ADLT (ELECTROSURGICAL) ×1 IMPLANT
EVACUATOR 1/8 PVC DRAIN (DRAIN) ×2 IMPLANT
FACESHIELD WRAPAROUND (MASK) ×10 IMPLANT
GAUZE SPONGE 2X2 8PLY STRL LF (GAUZE/BANDAGES/DRESSINGS) ×1 IMPLANT
GAUZE SPONGE 4X4 12PLY STRL (GAUZE/BANDAGES/DRESSINGS) IMPLANT
GLOVE BIOGEL PI IND STRL 7.5 (GLOVE) ×1 IMPLANT
GLOVE BIOGEL PI IND STRL 8 (GLOVE) ×1 IMPLANT
GLOVE BIOGEL PI INDICATOR 7.5 (GLOVE) ×1
GLOVE BIOGEL PI INDICATOR 8 (GLOVE) ×1
GLOVE SURG SS PI 7.5 STRL IVOR (GLOVE) ×2 IMPLANT
GLOVE SURG SS PI 8.0 STRL IVOR (GLOVE) ×4 IMPLANT
GOWN STRL REUS W/TWL XL LVL3 (GOWN DISPOSABLE) ×4 IMPLANT
HANDPIECE INTERPULSE COAX TIP (DISPOSABLE) ×1
IMMOBILIZER KNEE 20 (SOFTGOODS) ×2
IMMOBILIZER KNEE 20 THIGH 36 (SOFTGOODS) ×1 IMPLANT
KIT BASIN OR (CUSTOM PROCEDURE TRAY) ×2 IMPLANT
MANIFOLD NEPTUNE II (INSTRUMENTS) ×2 IMPLANT
NDL SAFETY ECLIPSE 18X1.5 (NEEDLE) ×1 IMPLANT
NEEDLE HYPO 18GX1.5 SHARP (NEEDLE) ×1
NS IRRIG 1000ML POUR BTL (IV SOLUTION) IMPLANT
PACK TOTAL JOINT (CUSTOM PROCEDURE TRAY) ×2 IMPLANT
PADDING CAST COTTON 6X4 STRL (CAST SUPPLIES) IMPLANT
POSITIONER SURGICAL ARM (MISCELLANEOUS) ×2 IMPLANT
SET HNDPC FAN SPRY TIP SCT (DISPOSABLE) ×1 IMPLANT
SPONGE GAUZE 2X2 STER 10/PKG (GAUZE/BANDAGES/DRESSINGS) ×1
SPONGE SURGIFOAM ABS GEL 100 (HEMOSTASIS) IMPLANT
STAPLER VISISTAT (STAPLE) IMPLANT
STRIP CLOSURE SKIN 1/2X4 (GAUZE/BANDAGES/DRESSINGS) ×4 IMPLANT
SUCTION FRAZIER 12FR DISP (SUCTIONS) ×2 IMPLANT
SUT BONE WAX W31G (SUTURE) IMPLANT
SUT MNCRL AB 4-0 PS2 18 (SUTURE) IMPLANT
SUT VIC AB 1 CT1 27 (SUTURE) ×1
SUT VIC AB 1 CT1 27XBRD ANTBC (SUTURE) ×1 IMPLANT
SUT VIC AB 2-0 CT1 27 (SUTURE) ×3
SUT VIC AB 2-0 CT1 TAPERPNT 27 (SUTURE) ×3 IMPLANT
SUT VLOC 180 0 24IN GS25 (SUTURE) ×2 IMPLANT
SYR 20CC LL (SYRINGE) ×2 IMPLANT
TOWEL OR 17X26 10 PK STRL BLUE (TOWEL DISPOSABLE) ×2 IMPLANT
TOWEL OR NON WOVEN STRL DISP B (DISPOSABLE) IMPLANT
TOWER CARTRIDGE SMART MIX (DISPOSABLE) ×2 IMPLANT
TRAY FOLEY CATH 14FRSI W/METER (CATHETERS) ×2 IMPLANT
WATER STERILE IRR 1500ML POUR (IV SOLUTION) ×2 IMPLANT
WRAP KNEE MAXI GEL POST OP (GAUZE/BANDAGES/DRESSINGS) ×2 IMPLANT

## 2014-05-02 NOTE — Brief Op Note (Signed)
05/02/2014  9:33 AM  PATIENT:  Jeanne Brooks  58 y.o. female  PRE-OPERATIVE DIAGNOSIS:  OSTEOARTHRITIS OF LEFT KNEE  POST-OPERATIVE DIAGNOSIS:  OSTEOARTHRITIS OF LEFT KNEE  PROCEDURE:  Procedure(s): LEFT TOTAL KNEE ARTHROPLASTY (Left)  SURGEON:  Surgeon(s) and Role:    * Javier Docker, MD - Primary  PHYSICIAN ASSISTANT:   ASSISTANTS: Bissell   ANESTHESIA:   general  EBL:  Total I/O In: 2000 [I.V.:2000] Out: 325 [Urine:325]  BLOOD ADMINISTERED:none  DRAINS: (Hemeovac) Hemovact drain(s) in the open with  Suction Open   LOCAL MEDICATIONS USED:  MARCAINE     SPECIMEN:  No Specimen  DISPOSITION OF SPECIMEN:  N/A  COUNTS:  YES  TOURNIQUET:  * Missing tourniquet times found for documented tourniquets in log:  [id number] *  DICTATION: .Other Dictation: Dictation Number [id number]  PLAN OF CARE: Admit to inpatient   PATIENT DISPOSITION:  PACU - hemodynamically stable.   Delay start of Pharmacological VTE agent (>24hrs) due to surgical blood loss or risk of bleeding: no

## 2014-05-02 NOTE — Interval H&P Note (Signed)
History and Physical Interval Note:  05/02/2014 7:05 AM  Jeanne Brooks  has presented today for surgery, with the diagnosis of OSTEOARTHRITIS OF LEFT KNEE  The various methods of treatment have been discussed with the patient and family. After consideration of risks, benefits and other options for treatment, the patient has consented to  Procedure(s): LEFT TOTAL KNEE ARTHROPLASTY (Left) as a surgical intervention .  The patient's history has been reviewed, patient examined, no change in status, stable for surgery.  I have reviewed the patient's chart and labs.  Questions were answered to the patient's satisfaction.     Benett Swoyer C

## 2014-05-02 NOTE — H&P (View-Only) (Signed)
Jeanne Brooks DOB: 07-Mar-1956 Married / Language: English / Race: White Female  H&P date: 04/25/14  Chief Complaint: Left knee pain  History of Present Illness The patient is a 58 year old female who comes in today for a preoperative history and physical. The patient is scheduled for a left total knee arthroplasty to be performed by Dr. Javier Docker, MD at [facility] on May 02, 2014. Jeanne Brooks has been experiencing ongoing L knee pain refractory to steroid injections, bracing, quad strengthening, activity modifications, NSAIDs, relative rest, now interfering with ADL's.  Dr. Shelle Iron and the patient mutually agreed to proceed with a total knee replacement. Risks and benefits of the procedure were discussed including stiffness, suboptimal range of motion, persistent pain, infection requiring removal of prosthesis and reinsertion, need for prophylactic antibiotics in the future, for example, dental procedures, possible need for manipulation, revision in the future and also anesthetic complications including DVT, PE, etc. We discussed the perioperative course, time in the hospital, postoperative recovery and the need for elevation to control swelling. We also discussed the predicted range of motion and the probability that squatting and kneeling would be unobtainable in the future. In addition, postoperative anticoagulation was discussed. We have obtained preoperative medical clearance as necessary- Elizabeth Palau NP. Provided her illustrated handout and discussed it in detail. They will enroll in the total joint replacement educational forum at the hospital.  Allergies CODEINE12/01/2010 Nausea. EPINEPHRINE08/15/2007 Rapid pulse. AUGMENTIN08/15/2007 Vomiting. HYDROCODONE08/15/2007 Nausea.  Family History Heart Disease father Cancer First Degree Relatives. mother and father First Degree Relatives Diabetes Mellitus mother Cerebrovascular Accident grandfather fathers  side Hypertension mother and father Kidney disease grandmother fathers side Osteoarthritis father Other medical problems My mother was adopted unknown family hx for her mother Jeanne Brooks  Social History  Tobacco use Never smoker. 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 Hydrochlorothiazide (12.5MG  Capsule, Oral) Active. PROzac (Oral) Specific dose unknown - Active. Wellbutrin SR (Oral) Specific dose unknown - Active. Losartan Potassium (Oral) Specific dose unknown - Active. Norvasc (Oral) Specific dose unknown - Active. Aleve (  Tablet, Oral) Active. Zolpidem Tartrate (  Tablet, Oral) Active. Medications Reconciled  Past Surgical History Tubal Ligation with large bowel perforation requiring mini-laparotomy repair, 2000 Other Surgery trigger thumb release (Dr. Amanda Brooks) 2011; wisdom teeth extraction 1976  Past Medical Hx High blood pressure Anxiety Disorder Impaired Vision Depression Measles Rubella Menopause  Review of Systems General Not Present- Chills, Fatigue, Fever, Memory Loss, Night Sweats, Weight Gain and Weight Loss. Skin Not Present- Eczema, Hives, Itching, Lesions and Rash. HEENT Not Present- Dentures, Double Vision, Headache, Hearing Loss, Tinnitus and Visual Loss. Respiratory Not Present- Allergies, Chronic  Cough, Coughing up blood, Shortness of breath at rest and Shortness of breath with exertion. Cardiovascular Not Present- Chest Pain, Difficulty Breathing Lying Down, Murmur, Palpitations, Racing/skipping heartbeats and Swelling. Gastrointestinal Not Present- Abdominal Pain, Bloody Stool, Constipation, Diarrhea, Difficulty Swallowing, Heartburn, Jaundice, Loss of appetitie, Nausea and Vomiting. Female Genitourinary Present- Urinating  at Night. Not Present- Blood in Urine, Discharge, Flank Pain, Incontinence, Painful Urination, Urgency, Urinary frequency, Urinary Retention and Weak urinary stream. Musculoskeletal Present- Joint Pain, Joint Swelling and Morning Stiffness. Not Present- Back Pain, Muscle Pain, Muscle Weakness and Spasms. Neurological Not Present- Blackout spells, Difficulty with balance, Dizziness, Paralysis, Tremor and Weakness. Psychiatric Not Present- Insomnia.  Vitals Weight: 230 lb Height: 67in Body Surface Area: 2.22 m Body Mass Index: 36.02 kg/m  Physical Exam General Mental Status -Alert, cooperative and good historian. General Appearance-pleasant, Not in acute distress. Orientation-Oriented X3. Build & Nutrition-Overweight. Gait-Antalgic.  Head and Neck Head-normocephalic, atraumatic . Neck Global Assessment - supple, no bruit auscultated on the right, no bruit auscultated on the left.  Eye Pupil - Bilateral-Regular and Round. Motion - Bilateral-EOMI.  Chest and Lung Exam Auscultation Breath sounds - clear at anterior chest wall and clear at posterior chest wall. Adventitious sounds - No Adventitious sounds.  Cardiovascular Auscultation Rhythm - Regular rate and rhythm. Heart Sounds - S1 WNL and S2 WNL. Murmurs & Other Heart Sounds - Auscultation of the heart reveals - No Murmurs.  Abdomen Palpation/Percussion Tenderness - Abdomen is non-tender to palpation. Rigidity (guarding) - Abdomen is soft. Auscultation Auscultation of the  abdomen reveals - Bowel sounds normal.  Female Genitourinary Note: Not done, not pertinent to present illness  Musculoskeletal Note: Left Knee: Inspection and Palpation - Tenderness - medial joint line tender to palpation and lateral joint line tender to palpation, no tenderness to palpation of the superior calf, no tenderness to palpation of the pes anserine bursa, no tenderness to palpation of the quadriceps tendon, no tenderness to palpation of the patellar tendon, no tenderness to palpation of the patella, no tenderness to palpation of the fibular head, no tenderness to palpation of the peroneal nerve. Crepitus - mild patellofemoral crepitus. Patellar Tendon - no pain to palpation of the patellar tendon. Swelling - periarticular swelling present. Effusion - trace. Tissue tension/texture is - soft. Pulses - 2+. Sensation - intact to light touch. Skin - Color - no ecchymosis, no erythema. Strength and Tone - Quadriceps - 5/5. Hamstrings - 5/5. ROM: Flexion - AROM - 100 . Extension - AROM - 0 . Stability - Valgus Laxity at 30 - None. Valgus Laxity at 0 - None. Varus Laxity at 30 - None. Varus Laxity at 0 - None. Lachman - Negative. Anterior Drawer Test - Negative. Posterior Drawer Test - Negative. Left Knee - Deformities/Malalignments/Discrepancies - no deformities noted. Special Tests - McMurray Test (lateral) - negative. McMurray Test (medial) - negative. Patellar Compression Pain - moderate pain.  Imaging Note:prior standing xrays from 12/2013 reviewed. Left knee with severe medial and PF narrowing, bone-on-bone end-stage changes with peri-articular osteophytes, varus deformity, collapse of the medial joint space. Right knee with moderate medial narrowing and moderately severe PF narrowing.  Assessment & Plan  DJD Left Knee  Pt with ongoing L knee pain due to DJD, interfering with ADL's, refractory to conservative tx including steroid injections, quad strengthening, HEP, activity  modifications, NSAIDs, relative rest, bracing. She is scheduled for left total knee replacement by Dr. Shelle Iron on 05/02/14. Discussed the procedure itself as well as risks, complications and alternatives, including but not limited to DVT, PE, infx, bleeding, failure of procedure, need for secondary procedure including manipulation, nerve injury, ongoing pain/symptoms, anesthesia risk, even stroke or death. Also discussed typical post-op protocols, activity restrictions, need for PT, flexion/extension exercises, time out of work. Discussed need for DVT ppx post-op with Xarelto then ASA per  protocol. Discussed dental ppx. Also discussed limitations post-operatively such as kneeling and squatting. All questions were answered. Patient desires to proceed with surgery as scheduled. She has been cleared by her PCP. She has received Rx's for MRSA decolonization. Hospital labs received after her appt today, tested positive for staph aureus but negative for MRSA, will add clinda with her kefzol for appropriate coverage. Plan Percocet for pain with Robaxin, Colace, Xarelto for DVT ppx. Plan home with HHPT post-op. She will follow up 10-14 days post op for suture removal and xrays and will call with any questions or concerns in the interim.  Signed electronically by Andrez Grime, PA-C for Dr. Shelle Iron

## 2014-05-02 NOTE — Anesthesia Postprocedure Evaluation (Signed)
  Anesthesia Post-op Note  Patient: Jeanne Brooks  Procedure(s) Performed: Procedure(s) (LRB): LEFT TOTAL KNEE ARTHROPLASTY (Left)  Patient Location: PACU  Anesthesia Type: General  Level of Consciousness: awake and alert   Airway and Oxygen Therapy: Patient Spontanous Breathing  Post-op Pain: mild  Post-op Assessment: Post-op Vital signs reviewed, Patient's Cardiovascular Status Stable, Respiratory Function Stable, Patent Airway and No signs of Nausea or vomiting  Last Vitals:  Filed Vitals:   05/02/14 1030  BP: 138/75  Pulse: 90  Temp:   Resp: 16    Post-op Vital Signs: stable   Complications: No apparent anesthesia complications

## 2014-05-02 NOTE — Transfer of Care (Signed)
Immediate Anesthesia Transfer of Care Note  Patient: Jeanne Brooks  Procedure(s) Performed: Procedure(s): LEFT TOTAL KNEE ARTHROPLASTY (Left)  Patient Location: PACU  Anesthesia Type:General  Level of Consciousness: awake, alert , oriented and patient cooperative  Airway & Oxygen Therapy: Patient Spontanous Breathing and Patient connected to face mask oxygen  Post-op Assessment: Report given to PACU RN, Post -op Vital signs reviewed and stable and Patient moving all extremities  Post vital signs: Reviewed and stable  Complications: No apparent anesthesia complications

## 2014-05-03 ENCOUNTER — Encounter (HOSPITAL_COMMUNITY): Payer: Self-pay | Admitting: Specialist

## 2014-05-03 LAB — CBC
HEMATOCRIT: 34.8 % — AB (ref 36.0–46.0)
HEMOGLOBIN: 12 g/dL (ref 12.0–15.0)
MCH: 28.4 pg (ref 26.0–34.0)
MCHC: 34.5 g/dL (ref 30.0–36.0)
MCV: 82.5 fL (ref 78.0–100.0)
Platelets: 233 10*3/uL (ref 150–400)
RBC: 4.22 MIL/uL (ref 3.87–5.11)
RDW: 12.6 % (ref 11.5–15.5)
WBC: 11.3 10*3/uL — ABNORMAL HIGH (ref 4.0–10.5)

## 2014-05-03 LAB — BASIC METABOLIC PANEL
Anion gap: 12 (ref 5–15)
BUN: 12 mg/dL (ref 6–23)
CHLORIDE: 99 meq/L (ref 96–112)
CO2: 25 meq/L (ref 19–32)
Calcium: 8.8 mg/dL (ref 8.4–10.5)
Creatinine, Ser: 0.84 mg/dL (ref 0.50–1.10)
GFR calc Af Amer: 87 mL/min — ABNORMAL LOW (ref >90)
GFR calc non Af Amer: 75 mL/min — ABNORMAL LOW (ref >90)
Glucose, Bld: 115 mg/dL — ABNORMAL HIGH (ref 70–99)
POTASSIUM: 4.2 meq/L (ref 3.7–5.3)
SODIUM: 136 meq/L — AB (ref 137–147)

## 2014-05-03 MED ORDER — ACETAMINOPHEN 10 MG/ML IV SOLN
1000.0000 mg | Freq: Four times a day (QID) | INTRAVENOUS | Status: AC
  Administered 2014-05-03 – 2014-05-04 (×4): 1000 mg via INTRAVENOUS
  Filled 2014-05-03 (×4): qty 100

## 2014-05-03 MED ORDER — HYDROMORPHONE HCL 2 MG PO TABS
2.0000 mg | ORAL_TABLET | ORAL | Status: DC | PRN
  Administered 2014-05-03 (×3): 2 mg via ORAL
  Administered 2014-05-04: 4 mg via ORAL
  Administered 2014-05-04: 2 mg via ORAL
  Administered 2014-05-04 (×3): 4 mg via ORAL
  Administered 2014-05-05 (×3): 2 mg via ORAL
  Filled 2014-05-03: qty 1
  Filled 2014-05-03: qty 2
  Filled 2014-05-03 (×3): qty 1
  Filled 2014-05-03: qty 2
  Filled 2014-05-03: qty 1
  Filled 2014-05-03: qty 2
  Filled 2014-05-03: qty 1
  Filled 2014-05-03: qty 2
  Filled 2014-05-03: qty 1

## 2014-05-03 NOTE — Progress Notes (Addendum)
Patient ID: Jeanne Brooks, female   DOB: 1955/10/16, 58 y.o.   MRN: [id number] Subjective: 1 Day Post-Op Procedure(s) (LRB): LEFT TOTAL KNEE ARTHROPLASTY (Left) Patient reports pain as moderate- Left knee, well controlled with pain meds. No other c/o.  Patient has complaints of left knee pain  We will start therapy today. Plan is to go home with HHPT after hospital stay.  Objective: Vital signs in last 24 hours: Temp:  [97.4 F (36.3 C)-99.4 F (37.4 C)] 99.4 F (37.4 C) (09/25 0600) Pulse Rate:  [69-114] 74 (09/25 0600) Resp:  [9-19] 16 (09/25 0600) BP: (109-148)/(62-93) 148/62 mmHg (09/25 0600) SpO2:  [92 %-99 %] 99 % (09/25 0600)  Intake/Output from previous day:  Intake/Output Summary (Last 24 hours) at 05/03/14 0752 Last data filed at 05/03/14 7829  Gross per 24 hour  Intake 6187.5 ml  Output   6155 ml  Net   32.5 ml    Intake/Output this shift:    Labs: Results for orders placed during the hospital encounter of 05/02/14  CBC      Result Value Ref Range   WBC 11.3 (*) 4.0 - 10.5 K/uL   RBC 4.22  3.87 - 5.11 MIL/uL   Hemoglobin 12.0  12.0 - 15.0 g/dL   HCT 56.2 (*) 13.0 - 86.5 %   MCV 82.5  78.0 - 100.0 fL   MCH 28.4  26.0 - 34.0 pg   MCHC 34.5  30.0 - 36.0 g/dL   RDW 78.4  69.6 - 29.5 %   Platelets 233  150 - 400 K/uL  BASIC METABOLIC PANEL      Result Value Ref Range   Sodium 136 (*) 137 - 147 mEq/L   Potassium 4.2  3.7 - 5.3 mEq/L   Chloride 99  96 - 112 mEq/L   CO2 25  19 - 32 mEq/L   Glucose, Bld 115 (*) 70 - 99 mg/dL   BUN 12  6 - 23 mg/dL   Creatinine, Ser 2.84  0.50 - 1.10 mg/dL   Calcium 8.8  8.4 - 13.2 mg/dL   GFR calc non Af Amer 75 (*) >90 mL/min   GFR calc Af Amer 87 (*) >90 mL/min   Anion gap 12  5 - 15    Exam - Neurologically intact ABD soft Neurovascular intact Sensation intact distally Intact pulses distally Dorsiflexion/Plantar flexion intact Incision: dressing C/D/I and no drainage No cellulitis present Compartment soft no  calf pain or sign of DVT Dressing - clean, dry, no drainage Motor function intact - moving foot and toes well on exam.    Assessment/Plan: 1 Day Post-Op Procedure(s) (LRB): LEFT TOTAL KNEE ARTHROPLASTY (Left)  Advance diet Up with therapy D/C IV fluids Past Medical History  Diagnosis Date  . Hypertension   . Depression   . Arthritis     DVT Prophylaxis - Xarelto Protocol Weight-Bearing as tolerated to left leg Keep foley until tomorrow. No vaccines. Will pull drain later- 80cc output already this AM Possible D/C tomorrow depending on progress with PT today/tomorrow, otherwise Sunday, home with HHPT when ready Will discuss with Dr. Elissa Lovett, Dayna Barker. 05/03/2014, 7:52 AM

## 2014-05-03 NOTE — Progress Notes (Signed)
[facility]  [facility] is providing the following services: RW  If patient discharges after hours, please call [phone].   Renard Hamper 05/03/2014, 10:37 AM

## 2014-05-03 NOTE — Op Note (Signed)
Jeanne Brooks, Jeanne Brooks NO.:  [id number]  MEDICAL RECORD NO.:  [id number]  LOCATION:  [facility]                         FACILITY:  [facility]  PHYSICIAN:  Jene Every, M.D.    DATE OF BIRTH:  14-Dec-1955  DATE OF PROCEDURE: DATE OF DISCHARGE:                              OPERATIVE REPORT   PREOPERATIVE DIAGNOSES:  End-stage osteoarthritis, varus deformity of the left knee.  POSTOPERATIVE DIAGNOSES:  End-stage osteoarthritis, varus deformity of the left knee.  PROCEDURE PERFORMED:  Left total knee arthroplasty.  COMPONENTS UTILIZED:  DePuy rotating platform, 4 femur, 3 tibia, 10-mm insert, 32 patella.  ASSISTANT:  Jeanne Poche, PA, who was used throughout the case for closure, assistance in positioning and retraction.  ANESTHESIA:  General.  BRIEF HISTORY:  A 58 year old, end-stage osteoarthrosis of the knee, bone-on-bone, refractory to conservative treatment, severe factor activities of daily living, indicated for replacement of the degenerated knee.  Risk and benefits were discussed including bleeding, infection, damage to neurovascular structures, DVT, PE, anesthetic complications, etc.  TECHNIQUE:  With the patient in supine position, after induction of adequate general anesthesia, 2 g Kefzol and 900 of clindamycin, left lower extremity was prepped, draped, and exsanguinated in usual sterile fashion.  Thigh tourniquet was inflated to 300 mmHg.  Midline incision was made over the knee.  Full-thickness flaps developed.  Median parapatellar arthrotomy performed.  Soft tissues elevated medially preserving the MCL attachment.  Knee was flexed.  Tricompartmental bone- on-bone arthrosis noted.  Leksell used to remove the osteophytes, removed the medial and lateral menisci remnants as well as ACL.  Entered the femoral canal with a reamer just above the notch,  irrigated 5 degree left, 11 off the femur due to slight flexion contracture.  This was pinned.  Distal  femoral cut performed, then sized off the distal femur.  It was approximately just very close to a 4.  Just underneath that, this was pinned and then we lowered the jig by 2 mm just to be flushed with the anterior cortex, which was pinned.  We performed anterior, posterior, and chamfer cuts without notching.  Attention was turned towards the tibia.  It was subluxed with Jeanne Brooks.  External alignment guide used 3 off the medial side, which was the low side, bisecting the ankle parallel to the tibia.  Appropriate slope performed was cut.  Soft tissues protected posteriorly at all times.  Flexion- extension gaps were equivalent after testing completed.  The tibia sized to a 3, distal medial aspect of tibial tubercle.  The pin centrally drilled and the punch guide utilized.  We then performed our box cut bisecting the canal without difficulty, placed a trial femur and tibia and then 10-mm insert.  We had full extension, full flexion, good stability with varus valgus stressing 0-30 degrees.  We then turned our attention to the patella.  It was fairly thin and at 23.  We planned 7.5 from that and residual was 13.  We then trialed it to a 32, drilled our peg holes and trialed the patella trial.  Excellent patellofemoral tracking was noted.  All material was then removed.  We checked posteriorly, the popliteus was intact, cauterized the  geniculates, residual menisci removed as well.  Copiously irrigated with pulsatile lavage, flexed the knee, all surfaces thoroughly dried, mixed the cement on the back table, injected it into the tibia, digitally pressurizing it, impacted the tibial tray, redundant cement removed.  Impacted the femur, redundant cement removed.  Trial insert placed.  It was held in reduced with an axial load in extension throughout the curing of the cement.  Similar patellar button was cemented as well.  After curing of the cement, it was trialed and it tend to be optimal and we  removed redundant cement.  Copiously irrigated the wound.  We inserted 10-mm insert rotating platform, reduced it, full extension and full flexion to 140 degrees.  Good stability with varus valgus stressing 0-30 degrees. Excellent patellofemoral tracking.  We used Exparel in the periarticular tissues.  Hemovac was placed and brought out through a lateral stab wound in the skin.  Slight flexion of the knee reapproximated the patellar arthrotomy with 1 Vicryl and then it closed with a running V- Loc.  Subcu with 2-0 Monocryl subcuticular, knee flexion to 90 on gravity, negative anterior drawer, excellent stability, and excellent patellofemoral tracking.  Tourniquet was deflated, 2 hours of adequate revascularization of lower extremity appreciated.  The patient tolerated the procedure well.  No complications.  Transported to the recovery room in satisfactory condition.  Minimal blood loss.     Jene Every, M.D.     Jeanne Brooks  D:  05/02/2014  T:  05/02/2014  Job:  [id number]

## 2014-05-03 NOTE — Progress Notes (Signed)
CARE MANAGEMENT NOTE 05/03/2014  Patient:  Jeanne Brooks, Jeanne Brooks   Account Number:  [id number]  Date Initiated:  05/03/2014  Documentation initiated by:  Sherlin Sonier  Subjective/Objective Assessment:   rt total knee     Action/Plan:   home when stable   Anticipated DC Date:  05/05/2014   Anticipated DC Plan:  HOME W HOME HEALTH SERVICES  In-house referral  NA      DC Planning Services  CM consult      [facility] Choice  NA   Choice offered to / List presented to:  C-1 Patient   DME arranged  [facility]      DME agency  [facility]     HH arranged  HH-2 PT      [facility] agency  [facility]   Status of service:  In process, will continue to follow Medicare Important Message given?   (If response is "NO", the following Medicare IM given date fields will be blank) Date Medicare IM given:   Medicare IM given by:   Date Additional Medicare IM given:   Additional Medicare IM given by:    Discharge Disposition:    Per UR Regulation:  Reviewed for med. necessity/level of care/duration of stay  If discussed at Long Length of Stay Meetings, dates discussed:    Comments:  [id number]/Jerick Khachatryan L. Earlene Plater, RN, BSN, CCM. Chart reviewed for stay and discharge needs. Patient states she has a rolling walker at home.

## 2014-05-03 NOTE — Evaluation (Signed)
Physical Therapy Evaluation Patient Details Name: Jeanne Brooks MRN: [id number] DOB: May 31, 1956 Today's Date: 05/03/2014   History of Present Illness  Pt is a 58 year old female s/p L TKA.  Clinical Impression  Pt is s/p L TKA resulting in the deficits listed below (see PT Problem List). Pt will benefit from skilled PT to increase their independence and safety with mobility to allow discharge to the venue listed below.  Pt mobilizing well POD #1.  Pt plans to d/c home with spouse however her sister will be helping her into home, so discussed practicing stairs with sister present prior to d/c home.     Follow Up Recommendations Home health PT    Equipment Recommendations  None recommended by PT    Recommendations for Other Services       Precautions / Restrictions Precautions Precautions: Knee Required Braces or Orthoses: Knee Immobilizer - Left Restrictions Other Position/Activity Restrictions: WBAT      Mobility  Bed Mobility Overal bed mobility: Needs Assistance Bed Mobility: Supine to Sit     Supine to sit: Min assist     General bed mobility comments: verbal cues for technique. slight assist for trunk upright  Transfers Overall transfer level: Needs assistance Equipment used: Rolling walker (2 wheeled) Transfers: Sit to/from Stand Sit to Stand: Min guard;From elevated surface         General transfer comment: verbal cues for UE and LE positioning  Ambulation/Gait Ambulation/Gait assistance: Min guard Ambulation Distance (Feet): 60 Feet Assistive device: Rolling walker (2 wheeled) Gait Pattern/deviations: Step-to pattern;Antalgic Gait velocity: decr   General Gait Details: verbal cues for sequence, step length, RW positioning  Stairs            Wheelchair Mobility    Modified Rankin (Stroke Patients Only)       Balance                                             Pertinent Vitals/Pain Pain Assessment: 0-10 Pain  Score: 3  Pain Location: L knee Pain Descriptors / Indicators: Sore Pain Intervention(s): Limited activity within patient's tolerance;Monitored during session;Ice applied;Premedicated before session    Home Living Family/patient expects to be discharged to:: Private residence Living Arrangements: Spouse/significant other Available Help at Discharge: Family;Available 24 hours/day Type of Home: House Home Access: Stairs to enter Entrance Stairs-Rails: None Entrance Stairs-Number of Steps: 3 Home Layout: One level Home Equipment: Walker - 2 wheels      Prior Function Level of Independence: Independent               Hand Dominance        Extremity/Trunk Assessment               Lower Extremity Assessment: LLE deficits/detail   LLE Deficits / Details: L knee AAROM -4-40*, fair quad contraction     Communication   Communication: No difficulties  Cognition Arousal/Alertness: Awake/alert Behavior During Therapy: WFL for tasks assessed/performed Overall Cognitive Status: Within Functional Limits for tasks assessed                      General Comments      Exercises Total Joint Exercises Ankle Circles/Pumps: AROM;Both;15 reps Quad Sets: AROM;Both;15 reps Short Arc QuadBarbaraann Boys;Left;10 reps Heel Slides: AAROM;Left;10 reps Hip ABduction/ADduction: AROM;Left;10 reps      Assessment/Plan  PT Assessment Patient needs continued PT services  PT Diagnosis Difficulty walking;Acute pain   PT Problem List Decreased strength;Decreased activity tolerance;Decreased range of motion;Decreased mobility;Decreased knowledge of precautions;Decreased knowledge of use of DME;Pain  PT Treatment Interventions Functional mobility training;Patient/family education;Stair training;Gait training;DME instruction;Therapeutic activities;Therapeutic exercise   PT Goals (Current goals can be found in the Care Plan section) Acute Rehab PT Goals PT Goal Formulation: With  patient Time For Goal Achievement: 05/07/14 Potential to Achieve Goals: Good    Frequency 7X/week   Barriers to discharge        Co-evaluation               End of Session Equipment Utilized During Treatment: Gait belt Activity Tolerance: Patient tolerated treatment well Patient left: in chair;with call bell/phone within reach           Time: 0846-0913 PT Time Calculation (min): 27 min   Charges:   PT Evaluation $Initial PT Evaluation Tier I: 1 Procedure PT Treatments $Gait Training: 8-22 mins $Therapeutic Exercise: 8-22 mins   PT G Codes:          Yardley Lekas,KATHrine E 05/03/2014, 9:22 AM Zenovia Jarred, PT, DPT 05/03/2014 Pager: [phone]

## 2014-05-03 NOTE — Progress Notes (Signed)
Physical Therapy Treatment Note   05/03/14 1400  PT Visit Information  Last PT Received On 05/03/14  Assistance Needed +1  History of Present Illness Pt is a 58 year old female s/p L TKA.  PT Time Calculation  PT Start Time 1346  PT Stop Time 1405  PT Time Calculation (min) 19 min  Subjective Data  Subjective Pt ambulated in hallway and then assisted back to bed.  Precautions  Precautions Knee  Required Braces or Orthoses Knee Immobilizer - Left  Restrictions  Other Position/Activity Restrictions WBAT  Pain Assessment  Pain Assessment 0-10  Pain Score 5  Pain Location L knee  Pain Descriptors / Indicators Sore  Pain Intervention(s) Limited activity within patient's tolerance;Repositioned;Premedicated before session;Ice applied  Cognition  Arousal/Alertness Awake/alert  Behavior During Therapy WFL for tasks assessed/performed  Overall Cognitive Status Within Functional Limits for tasks assessed  Bed Mobility  Overal bed mobility Needs Assistance  Bed Mobility Sit to Supine  Sit to supine Min assist  General bed mobility comments assist for L LE  Transfers  Overall transfer level Needs assistance  Equipment used Rolling walker (2 wheeled)  Transfers Sit to/from Stand  Sit to Stand Min guard  General transfer comment verbal cues for UE and LE positioning  Ambulation/Gait  Ambulation/Gait assistance Min guard  Ambulation Distance (Feet) 60 Feet  Assistive device Rolling walker (2 wheeled)  Gait Pattern/deviations Step-to pattern;Antalgic  Gait velocity decr  General Gait Details verbal cues for sequence, step length, RW positioning  PT - End of Session  Equipment Utilized During Treatment Left knee immobilizer  Activity Tolerance Patient tolerated treatment well  Patient left in bed;with call bell/phone within reach;with family/visitor present  PT - Assessment/Plan  PT Plan Current plan remains appropriate  PT Frequency 7X/week  Follow Up Recommendations Home health  PT  PT equipment None recommended by PT  PT Goal Progression  Progress towards PT goals Progressing toward goals  PT General Charges  $$ ACUTE PT VISIT 1 Procedure  PT Treatments  $Gait Training 8-22 mins   Zenovia Jarred, PT, DPT 05/03/2014 Pager: [phone]

## 2014-05-03 NOTE — Progress Notes (Signed)
OT Cancellation Note  Patient Details Name: Jeanne Brooks MRN: [id number] DOB: [birth year]   Cancelled Treatment:    Reason Eval/Treat Not Completed: Pain limiting ability to participate - pt just back to bed and with increased pain.  Will check back tomorrow.  Angelene Giovanni [place], OTR/L [phone]  05/03/2014, 2:10 PM

## 2014-05-03 NOTE — Progress Notes (Signed)
CSW consulted for SNF placement. PN reviewed. PT is recommending HHPT following hospital d/c. RNCM will assist with d/c planning.  Suhayla Chisom LCSW [phone] 

## 2014-05-03 NOTE — Discharge Instructions (Signed)
Elevate leg above heart 6x a day for each Use knee immobilizer while walking until can SLR x 10 Use knee immobilizer in bed to keep knee in extension Aquacel dressing in place. May shower with aquacel dressing in place. If the dressing becomes saturated or begins to peel at the edges, may remove the dressing or trim the edges. Do not remove steri-strips if they are present. Place new dressing with gauze and tape or ACE bandage which should be kept clean and dry and changed daily. ==================================== Information on my medicine - XARELTO (Rivaroxaban)  This medication education was reviewed with me or my healthcare representative as part of my discharge preparation.  The pharmacist that spoke with me during my hospital stay was:  Jeanne Brooks, Ky Barban, RPH  Why was Xarelto prescribed for you? Xarelto was prescribed for you to reduce the risk of blood clots forming after orthopedic surgery. The medical term for these abnormal blood clots is venous thromboembolism (VTE).  What do you need to know about xarelto ? Take your Xarelto ONCE DAILY at the same time every day. You may take it either with or without food.  If you have difficulty swallowing the tablet whole, you may crush it and mix in applesauce just prior to taking your dose.  Take Xarelto exactly as prescribed by your doctor and DO NOT stop taking Xarelto without talking to the doctor who prescribed the medication.  Stopping without other VTE prevention medication to take the place of Xarelto may increase your risk of developing a clot.  After discharge, you should have regular check-up appointments with your healthcare provider that is prescribing your Xarelto.    What do you do if you miss a dose? If you miss a dose, take it as soon as you remember on the same day then continue your regularly scheduled once daily regimen the next day. Do not take two doses of Xarelto on the same day.   Important Safety  Information A possible side effect of Xarelto is bleeding. You should call your healthcare provider right away if you experience any of the following:   Bleeding from an injury or your nose that does not stop.   Unusual colored urine (red or dark brown) or unusual colored stools (red or black).   Unusual bruising for unknown reasons.   A serious fall or if you hit your head (even if there is no bleeding).  Some medicines may interact with Xarelto and might increase your risk of bleeding while on Xarelto. To help avoid this, consult your healthcare provider or pharmacist prior to using any new prescription or non-prescription medications, including herbals, vitamins, non-steroidal anti-inflammatory drugs (NSAIDs) and supplements.  This website has more information on Xarelto: VisitDestination.com.br.

## 2014-05-04 LAB — CBC
HCT: 36.8 % (ref 36.0–46.0)
HEMOGLOBIN: 12.4 g/dL (ref 12.0–15.0)
MCH: 28.2 pg (ref 26.0–34.0)
MCHC: 33.7 g/dL (ref 30.0–36.0)
MCV: 83.6 fL (ref 78.0–100.0)
Platelets: 275 10*3/uL (ref 150–400)
RBC: 4.4 MIL/uL (ref 3.87–5.11)
RDW: 12.8 % (ref 11.5–15.5)
WBC: 13.4 10*3/uL — AB (ref 4.0–10.5)

## 2014-05-04 NOTE — Progress Notes (Signed)
Occupational Therapy Evaluation Patient Details Name: CORIE ALLIS MRN: [id number] DOB: [birth year] Today's Date: 05/04/2014    History of Present Illness Pt is a 58 year old female s/p L TKA.   Clinical Impression   Pt making good progress. Completed all education regarding compensatory techniques, use of AE and DME for ADL with pt/family. Pt will be able to D/C home with S of family when medically stable. OT signing off.    Follow Up Recommendations  No OT follow up;Supervision - Intermittent    Equipment Recommendations  None recommended by OT    Recommendations for Other Services       Precautions / Restrictions Precautions Precautions: Knee Required Braces or Orthoses: Knee Immobilizer - Left Restrictions Other Position/Activity Restrictions: WBAT      Mobility Bed Mobility Overal bed mobility: Needs Assistance Bed Mobility: Supine to Sit     Supine to sit: Supervision     General bed mobility comments: pt moved L leg independently  Transfers Overall transfer level: Needs assistance Equipment used: Rolling walker (2 wheeled) Transfers: Sit to/from [employer] Sit to Stand: Supervision Stand pivot transfers: Supervision       General transfer comment: vc or hand placement with use of RW    Balance                                            ADL Overall ADL's : Needs assistance/impaired     Grooming: Set up   Upper Body Bathing: Set up;Standing   Lower Body Bathing: Minimal assistance;Sit to/from stand   Upper Body Dressing : Set up   Lower Body Dressing: Minimal assistance;Sit to/from stand   Toilet Transfer: Supervision/safety;Ambulation;BSC   Toileting- Clothing Manipulation and Hygiene: Modified independent;Sit to/from Nurse, children's Details (indicate cue type and reason): Educated pt/family on tub transfer technique Functional mobility during ADLs: Supervision/safety;Rolling  walker General ADL Comments: Educated pt/family on compensatory techniques for ADL in addition to use of available AE. Pt verbalized understanding.     Vision                     Perception     Praxis      Pertinent Vitals/Pain Pain Assessment: 0-10 Pain Score: 5  Pain Location: L knee Pain Descriptors / Indicators: Aching Pain Intervention(s): Limited activity within patient's tolerance;Monitored during session;Repositioned     Hand Dominance     Extremity/Trunk Assessment Upper Extremity Assessment Upper Extremity Assessment: Overall WFL for tasks assessed   Lower Extremity Assessment Lower Extremity Assessment: Defer to PT evaluation   Cervical / Trunk Assessment Cervical / Trunk Assessment: Normal   Communication Communication Communication: No difficulties   Cognition Arousal/Alertness: Awake/alert Behavior During Therapy: WFL for tasks assessed/performed Overall Cognitive Status: Within Functional Limits for tasks assessed                     General Comments       Exercises       Shoulder Instructions      Home Living Family/patient expects to be discharged to:: Private residence Living Arrangements: Spouse/significant other Available Help at Discharge: Family;Available 24 hours/day Type of Home: House Home Access: Stairs to enter [employer] of Steps: 3 Entrance Stairs-Rails: None Home Layout: One level     Bathroom Shower/Tub: Tub/shower unit Shower/tub characteristics: Corporate treasurer  Toilet: Standard Bathroom Accessibility: Yes How Accessible: Accessible via walker Home Equipment: Walker - 2 wheels;Bedside commode          Prior Functioning/Environment Level of Independence: Independent        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[employer]    OT Diagnosis:     OT Problem List:     OT Treatment/Interventions:      OT Goals(Current goals can be found in the care plan section) Acute Rehab OT Goals Patient Stated  Goal: to get back to work OT Goal Formulation: With patient (eval only)  OT Frequency:     Barriers to D/C:            Co-evaluation              End of Session Equipment Utilized During Treatment: Gait belt;Rolling walker;Left knee immobilizer CPM Left Knee CPM Left Knee: Off Nurse Communication: Mobility status  Activity Tolerance: Patient tolerated treatment well Patient left: in chair;with call bell/phone within reach;with family/visitor present   Time: 1610-9604 OT Time Calculation (min): 46 min Charges:  OT General Charges $OT Visit: 1 Procedure OT Evaluation $Initial OT Evaluation Tier I: 1 Procedure OT Treatments $Self Care/Home Management : 23-37 mins G-Codes:    Shakora Nordquist,HILLARY 12-May-2014, 9:34 AM   Luisa Dago, OTR/L  [phone] 2014-05-12

## 2014-05-04 NOTE — Progress Notes (Signed)
Physical Therapy Treatment Patient Details Name: Jeanne Brooks MRN: [id number] DOB: Aug 11, 1955 Today's Date: 05/04/2014    History of Present Illness Pt is a 58 year old female s/p L TKA.    PT Comments    Pt ambulated in hallway and performed LE exercises.  Plans per chart for possible d/c home tomorrow.  Follow Up Recommendations  Home health PT     Equipment Recommendations  None recommended by PT    Recommendations for Other Services       Precautions / Restrictions Precautions Precautions: Knee Required Braces or Orthoses: Knee Immobilizer - Left Restrictions Other Position/Activity Restrictions: WBAT    Mobility  Bed Mobility Overal bed mobility: Needs Assistance Bed Mobility: Supine to Sit     Supine to sit: Supervision     General bed mobility comments: pt up in recliner on arrival  Transfers Overall transfer level: Needs assistance Equipment used: Rolling walker (2 wheeled) Transfers: Sit to/from Stand Sit to Stand: Supervision Stand pivot transfers: Supervision       General transfer comment: verbal cues for UE and LE positioning  Ambulation/Gait Ambulation/Gait assistance: Min guard Ambulation Distance (Feet): 80 Feet Assistive device: Rolling walker (2 wheeled) Gait Pattern/deviations: Step-to pattern;Antalgic Gait velocity: decr   General Gait Details: verbal cues for step length, RW positioning   Stairs            Wheelchair Mobility    Modified Rankin (Stroke Patients Only)       Balance                                    Cognition Arousal/Alertness: Awake/alert Behavior During Therapy: WFL for tasks assessed/performed Overall Cognitive Status: Within Functional Limits for tasks assessed                      Exercises Total Joint Exercises Ankle Circles/Pumps: AROM;Both;15 reps;Supine Quad Sets: AROM;Both;15 reps;Supine Towel Squeeze: AROM;Both;15 reps;Supine Short Arc Quad: AAROM;Left;10  reps;Supine Heel Slides: AAROM;Left;10 reps;Supine Hip ABduction/ADduction: AROM;Left;10 reps;Supine Straight Leg Raises: AAROM;Left;10 reps;Supine    General Comments        Pertinent Vitals/Pain Pain Assessment: 0-10 Pain Score: 4  Pain Location: L knee Pain Descriptors / Indicators: Aching Pain Intervention(s): Limited activity within patient's tolerance;Monitored during session;Ice applied;Repositioned    Home Living Family/patient expects to be discharged to:: Private residence Living Arrangements: Spouse/significant other Available Help at Discharge: Family;Available 24 hours/day Type of Home: House Home Access: Stairs to enter Entrance Stairs-Rails: None Home Layout: One level Home Equipment: Environmental consultant - 2 wheels;Bedside commode      Prior Function Level of Independence: Independent      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[employer]   PT Goals (current goals can now be found in the care plan section) Acute Rehab PT Goals Patient Stated Goal: to get back to work Progress towards PT goals: Progressing toward goals    Frequency  7X/week    PT Plan Current plan remains appropriate    Co-evaluation             End of Session Equipment Utilized During Treatment: Left knee immobilizer Activity Tolerance: Patient tolerated treatment well Patient left: in bed;with call bell/phone within reach     Time: 1041-1105 PT Time Calculation (min): 24 min  Charges:  $Gait Training: 8-22 mins $Therapeutic Exercise: 8-22 mins  G Codes:      Jeanne Brooks,Jeanne Brooks 05/17/2014, 12:37 PM Zenovia Jarred, PT, DPT 05-17-14 Pager: [phone]

## 2014-05-04 NOTE — Progress Notes (Signed)
Physical Therapy Treatment Note   05/04/14 1500  PT Visit Information  Last PT Received On 05/04/14  Assistance Needed +1  History of Present Illness Pt is a 58 year old female s/p L TKA.  PT Time Calculation  PT Start Time 1445  PT Stop Time 1505  PT Time Calculation (min) 20 min  Subjective Data  Subjective Pt assisted with ambulating in hallway then bathroom and back to bed.  Precautions  Precautions Knee  Required Braces or Orthoses Knee Immobilizer - Left  Restrictions  Other Position/Activity Restrictions WBAT  Pain Assessment  Pain Assessment 0-10  Pain Score 5  Pain Location L knee  Pain Descriptors / Indicators Aching  Pain Intervention(s) Limited activity within patient's tolerance;Premedicated before session;Monitored during session;Repositioned;Ice applied  Cognition  Arousal/Alertness Awake/alert  Behavior During Therapy WFL for tasks assessed/performed  Overall Cognitive Status Within Functional Limits for tasks assessed  Bed Mobility  Overal bed mobility Needs Assistance  Bed Mobility Supine to Sit;Sit to Supine  Supine to sit Supervision  Sit to supine Min assist  General bed mobility comments assist for L LE onto bed due to pain  Transfers  Overall transfer level Needs assistance  Equipment used Rolling walker (2 wheeled)  Transfers Sit to/from Stand  Sit to Stand Supervision  General transfer comment verbal cues for UE and LE positioning  Ambulation/Gait  Ambulation/Gait assistance Min guard  Ambulation Distance (Feet) 80 Feet  Assistive device Rolling walker (2 wheeled)  Gait Pattern/deviations Step-to pattern;Antalgic  Gait velocity decr  General Gait Details verbal cues for step length, RW positioning  PT - End of Session  Equipment Utilized During Treatment Left knee immobilizer  Activity Tolerance Patient tolerated treatment well  Patient left in bed;with call bell/phone within reach  PT - Assessment/Plan  PT Plan Current plan remains  appropriate  PT Frequency 7X/week  Follow Up Recommendations Home health PT  PT equipment None recommended by PT  PT Goal Progression  Progress towards PT goals Progressing toward goals  PT General Charges  $$ ACUTE PT VISIT 1 Procedure  PT Treatments  $Gait Training 8-22 mins   Zenovia Jarred, PT, DPT 05/04/2014 Pager: [phone]

## 2014-05-04 NOTE — Progress Notes (Signed)
Subjective: 2 Days Post-Op Procedure(s) (LRB): LEFT TOTAL KNEE ARTHROPLASTY (Left) Patient reports pain as 3 on 0-10 scale.    Objective: Vital signs in last 24 hours: Temp:  [97.5 F (36.4 C)-99.3 F (37.4 C)] 98.4 F (36.9 C) (09/26 0549) Pulse Rate:  [80-87] 86 (09/26 0549) Resp:  [16-22] 18 (09/26 0549) BP: (111-143)/(52-79) 128/75 mmHg (09/26 0549) SpO2:  [93 %-99 %] 98 % (09/26 0549)  Intake/Output from previous day: 09/25 0701 - 09/26 0700 In: 1463.6 [P.O.:480; I.V.:783.6; IV Piggyback:200] Out: 2450 [Urine:2450] Intake/Output this shift:     Recent Labs  05/03/14 0430 05/04/14 0540  HGB 12.0 12.4    Recent Labs  05/03/14 0430 05/04/14 0540  WBC 11.3* 13.4*  RBC 4.22 4.40  HCT 34.8* 36.8  PLT 233 275    Recent Labs  05/03/14 0430  NA 136*  K 4.2  CL 99  CO2 25  BUN 12  CREATININE 0.84  GLUCOSE 115*  CALCIUM 8.8   No results found for this basename: LABPT, INR,  in the last 72 hours  Neurologically intact Intact pulses distally Incision: no drainage No cellulitis present  Assessment/Plan: 2 Days Post-Op Procedure(s) (LRB): LEFT TOTAL KNEE ARTHROPLASTY (Left) Advance diet Up with therapy Plan for discharge tomorrow  Pilar Westergaard C 05/04/2014, 8:02 AM

## 2014-05-04 NOTE — Progress Notes (Signed)
CARE MANAGEMENT NOTE 05/04/2014  Patient:  JAIDYN, USERY   Account Number:  [id number]  Date Initiated:  05/03/2014  Documentation initiated by:  DAVIS,RHONDA  Subjective/Objective Assessment:   rt total knee     Action/Plan:   home when stable   Anticipated DC Date:  05/05/2014   Anticipated DC Plan:  HOME W HOME HEALTH SERVICES  In-house referral  NA      DC Planning Services  CM consult      [facility] Choice  NA   Choice offered to / List presented to:  C-1 Patient   DME arranged  BEDSIDE COMMODE      DME agency  [facility]     HH arranged  HH-2 PT      [facility] agency  [facility]   Status of service:  Completed, signed off Medicare Important Message given?   (If response is "NO", the following Medicare IM given date fields will be blank) Date Medicare IM given:   Medicare IM given by:   Date Additional Medicare IM given:   Additional Medicare IM given by:    Discharge Disposition:  HOME W HOME HEALTH SERVICES  Per UR Regulation:  Reviewed for med. necessity/level of care/duration of stay  If discussed at Long Length of Stay Meetings, dates discussed:    Comments:  05/04/2014 1130 NCM spoke to pt and 3n1 was delivered to her room. Agreeable to [facility] for [facility]. [facility] preoperatively arranged. Has RW at home. Message left to attending for [facility] orders. Isidoro Donning RN CCM Case Mgmt phone [phone]  [phone] L. Earlene Plater, RN, BSN, CCM. Chart reviewed for stay and discharge needs. Patient states she has a rolling walker at home.

## 2014-05-05 LAB — CBC
HCT: 35.1 % — ABNORMAL LOW (ref 36.0–46.0)
Hemoglobin: 11.9 g/dL — ABNORMAL LOW (ref 12.0–15.0)
MCH: 28.1 pg (ref 26.0–34.0)
MCHC: 33.9 g/dL (ref 30.0–36.0)
MCV: 83 fL (ref 78.0–100.0)
Platelets: 232 10*3/uL (ref 150–400)
RBC: 4.23 MIL/uL (ref 3.87–5.11)
RDW: 12.7 % (ref 11.5–15.5)
WBC: 10.7 10*3/uL — ABNORMAL HIGH (ref 4.0–10.5)

## 2014-05-05 MED ORDER — HYDROMORPHONE HCL 2 MG PO TABS: 2.0000 mg | ORAL_TABLET | Freq: Four times a day (QID) | ORAL | Status: DC | PRN

## 2014-05-05 NOTE — Progress Notes (Signed)
Patient discharged to home.  Reviewed discharge paperwork with patient, patient verbalized understanding.  Prescriptions given to patient.  Gave patient information on 2 pharmacies open 24/7 in order to get RX's filled.  Patient escorted from unit via wheelchair with NT.  Dorothyann Peng RN

## 2014-05-05 NOTE — Progress Notes (Signed)
Patient ID: Jeanne Brooks, female   DOB: 27-Oct-1955, 58 y.o.   MRN: [id number] Subjective: 3 Days Post-Op Procedure(s) (LRB): LEFT TOTAL KNEE ARTHROPLASTY (Left)    Patient reports pain as moderate.  Walked more in halls yesterday, feels she is ready to go home today  Objective:   VITALS:   Filed Vitals:   05/05/14 0505  BP: 138/66  Pulse: 98  Temp: 98.3 F (36.8 C)  Resp: 20    Neurovascular intact Incision: dressing C/D/I  LABS  Recent Labs  05/03/14 0430 05/04/14 0540 05/05/14 0500  HGB 12.0 12.4 11.9*  HCT 34.8* 36.8 35.1*  WBC 11.3* 13.4* 10.7*  PLT 233 275 232     Recent Labs  05/03/14 0430  NA 136*  K 4.2  BUN 12  CREATININE 0.84  GLUCOSE 115*    No results found for this basename: LABPT, INR,  in the last 72 hours   Assessment/Plan: 3 Days Post-Op Procedure(s) (LRB): LEFT TOTAL KNEE ARTHROPLASTY (Left)   Up with therapy Discharge home with home health today

## 2014-05-05 NOTE — Progress Notes (Signed)
Physical Therapy Treatment Patient Details Name: Jeanne Brooks MRN: [id number] DOB: 1956-04-02 Today's Date: 05/05/2014    History of Present Illness Pt is a 58 year old female s/p L TKA.    PT Comments    Pt ambulated in hallway and practiced stair technique with sister present and assisting with RW.  Pt provided with stair handout as well.  Pt feels ready for d/c home today.  Follow Up Recommendations  Home health PT     Equipment Recommendations  None recommended by PT    Recommendations for Other Services       Precautions / Restrictions Precautions Precautions: Knee Required Braces or Orthoses: Knee Immobilizer - Left Restrictions Other Position/Activity Restrictions: WBAT    Mobility  Bed Mobility Overal bed mobility: Needs Assistance Bed Mobility: Supine to Sit     Supine to sit: Supervision     General bed mobility comments: increased time and effort, verbal cues for self assist  Transfers Overall transfer level: Needs assistance Equipment used: Rolling walker (2 wheeled) Transfers: Sit to/from Stand Sit to Stand: Supervision         General transfer comment: verbal cues for UE and LE positioning  Ambulation/Gait Ambulation/Gait assistance: Min guard Ambulation Distance (Feet): 140 Feet Assistive device: Rolling walker (2 wheeled) Gait Pattern/deviations: Step-to pattern;Antalgic Gait velocity: decr   General Gait Details: verbal cues for step length, RW positioning   Stairs Stairs: Yes Stairs assistance: Min guard Stair Management: Step to pattern;Backwards;With walker Number of Stairs: 2 General stair comments: verbal cues for safety, sequence, RW positioning, pt's sister present and assisted with holding RW, provided handout  Wheelchair Mobility    Modified Rankin (Stroke Patients Only)       Balance                                    Cognition Arousal/Alertness: Awake/alert Behavior During Therapy: WFL for  tasks assessed/performed Overall Cognitive Status: Within Functional Limits for tasks assessed                      Exercises Total Joint Exercises Ankle Circles/Pumps: AROM;Both;15 reps;Supine Quad Sets: AROM;Both;15 reps;Supine Towel Squeeze: AROM;Both;15 reps;Supine Short Arc Quad: AAROM;Left;10 reps;Supine Heel Slides: AAROM;Left;10 reps;Supine Hip ABduction/ADduction: AROM;Left;Supine;15 reps Straight Leg Raises: AAROM;Left;10 reps;Supine    General Comments        Pertinent Vitals/Pain Pain Assessment: 0-10 Pain Score: 5  Pain Location: L knee Pain Descriptors / Indicators: Aching;Sore Pain Intervention(s): Limited activity within patient's tolerance;Monitored during session;Repositioned;Ice applied    Home Living                      Prior Function            PT Goals (current goals can now be found in the care plan section) Progress towards PT goals: Progressing toward goals    Frequency  7X/week    PT Plan Current plan remains appropriate    Co-evaluation             End of Session Equipment Utilized During Treatment: Left knee immobilizer;Gait belt Activity Tolerance: Patient tolerated treatment well Patient left: with call bell/phone within reach;in chair;with family/visitor present     Time: 1478-2956 PT Time Calculation (min): 29 min  Charges:  $Gait Training: 8-22 mins $Therapeutic Exercise: 8-22 mins  G CodesSarajane Jews 05-22-14, 10:45 AM Zenovia Jarred, PT, DPT 2014-05-22 Pager: [phone]

## 2014-05-05 NOTE — Discharge Summary (Signed)
Physician Discharge Summary   Patient ID: Jeanne Brooks MRN: [id number] DOB/AGE: Feb 19, 1956 58 y.o.  Admit date: 05/02/2014 Discharge date:   Primary Diagnosis: left knee DJD  Admission Diagnoses:  Past Medical History  Diagnosis Date  . Hypertension   . Depression   . Arthritis    Discharge Diagnoses:   Active Problems:   Left knee DJD  Estimated body mass index is 36.33 kg/(m^2) as calculated from the following:   Height as of this encounter: _0  (1.702 m).   Weight as of this encounter: 105.235 kg (232 lb).  Procedure:  Procedure(s) (LRB): LEFT TOTAL KNEE ARTHROPLASTY (Left)   Consults: None  HPI: see H&P Laboratory Data: Admission on 05/02/2014  Component Date Value Ref Range Status  . WBC 05/03/2014 11.3* 4.0 - 10.5 K/uL Final  . RBC 05/03/2014 4.22  3.87 - 5.11 MIL/uL Final  . Hemoglobin 05/03/2014 12.0  12.0 - 15.0 g/dL Final  . HCT 05/03/2014 34.8* 36.0 - 46.0 % Final  . MCV 05/03/2014 82.5  78.0 - 100.0 fL Final  . MCH 05/03/2014 28.4  26.0 - 34.0 pg Final  . MCHC 05/03/2014 34.5  30.0 - 36.0 g/dL Final  . RDW 05/03/2014 12.6  11.5 - 15.5 % Final  . Platelets 05/03/2014 233  150 - 400 K/uL Final  . Sodium 05/03/2014 136* 137 - 147 mEq/L Final  . Potassium 05/03/2014 4.2  3.7 - 5.3 mEq/L Final  . Chloride 05/03/2014 99  96 - 112 mEq/L Final  . CO2 05/03/2014 25  19 - 32 mEq/L Final  . Glucose, Bld 05/03/2014 115* 70 - 99 mg/dL Final  . BUN 05/03/2014 12  6 - 23 mg/dL Final  . Creatinine, Ser 05/03/2014 0.84  0.50 - 1.10 mg/dL Final  . Calcium 05/03/2014 8.8  8.4 - 10.5 mg/dL Final  . GFR calc non Af Amer 05/03/2014 75* >90 mL/min Final  . GFR calc Af Amer 05/03/2014 87* >90 mL/min Final   Comment: (NOTE)                          The eGFR has been calculated using the CKD EPI equation.                          This calculation has not been validated in all clinical situations.                          eGFR's persistently <90 mL/min signify possible  Chronic Kidney                          Disease.  . Anion gap 05/03/2014 12  5 - 15 Final  . WBC 05/04/2014 13.4* 4.0 - 10.5 K/uL Final  . RBC 05/04/2014 4.40  3.87 - 5.11 MIL/uL Final  . Hemoglobin 05/04/2014 12.4  12.0 - 15.0 g/dL Final  . HCT 05/04/2014 36.8  36.0 - 46.0 % Final  . MCV 05/04/2014 83.6  78.0 - 100.0 fL Final  . MCH 05/04/2014 28.2  26.0 - 34.0 pg Final  . MCHC 05/04/2014 33.7  30.0 - 36.0 g/dL Final  . RDW 05/04/2014 12.8  11.5 - 15.5 % Final  . Platelets 05/04/2014 275  150 - 400 K/uL Final  . WBC 05/05/2014 10.7* 4.0 - 10.5 K/uL Final  . RBC 05/05/2014 4.23  3.87 - 5.11 MIL/uL  Final  . Hemoglobin 05/05/2014 11.9* 12.0 - 15.0 g/dL Final  . HCT 05/05/2014 35.1* 36.0 - 46.0 % Final  . MCV 05/05/2014 83.0  78.0 - 100.0 fL Final  . MCH 05/05/2014 28.1  26.0 - 34.0 pg Final  . MCHC 05/05/2014 33.9  30.0 - 36.0 g/dL Final  . RDW 05/05/2014 12.7  11.5 - 15.5 % Final  . Platelets 05/05/2014 232  150 - 400 K/uL Final  Hospital Outpatient Visit on 04/25/2014  Component Date Value Ref Range Status  . Sodium 04/25/2014 137  137 - 147 mEq/L Final  . Potassium 04/25/2014 3.8  3.7 - 5.3 mEq/L Final  . Chloride 04/25/2014 100  96 - 112 mEq/L Final  . CO2 04/25/2014 22  19 - 32 mEq/L Final  . Glucose, Bld 04/25/2014 107* 70 - 99 mg/dL Final  . BUN 04/25/2014 20  6 - 23 mg/dL Final  . Creatinine, Ser 04/25/2014 1.10  0.50 - 1.10 mg/dL Final  . Calcium 04/25/2014 9.7  8.4 - 10.5 mg/dL Final  . GFR calc non Af Amer 04/25/2014 54* >90 mL/min Final  . GFR calc Af Amer 04/25/2014 63* >90 mL/min Final   Comment: (NOTE)                          The eGFR has been calculated using the CKD EPI equation.                          This calculation has not been validated in all clinical situations.                          eGFR's persistently <90 mL/min signify possible Chronic Kidney                          Disease.  . Anion gap 04/25/2014 15  5 - 15 Final  . WBC 04/25/2014 10.5  4.0 -  10.5 K/uL Final  . RBC 04/25/2014 5.14* 3.87 - 5.11 MIL/uL Final  . Hemoglobin 04/25/2014 14.5  12.0 - 15.0 g/dL Final  . HCT 04/25/2014 42.1  36.0 - 46.0 % Final  . MCV 04/25/2014 81.9  78.0 - 100.0 fL Final  . MCH 04/25/2014 28.2  26.0 - 34.0 pg Final  . MCHC 04/25/2014 34.4  30.0 - 36.0 g/dL Final  . RDW 04/25/2014 12.8  11.5 - 15.5 % Final  . Platelets 04/25/2014 281  150 - 400 K/uL Final  . Prothrombin Time 04/25/2014 14.1  11.6 - 15.2 seconds Final  . INR 04/25/2014 1.09  0.00 - 1.49 Final  . ABO/RH(D) 04/25/2014 O POS   Final  . Antibody Screen 04/25/2014 NEG   Final  . Sample Expiration 04/25/2014 05/05/2014   Final  . Color, Urine 04/25/2014 YELLOW  YELLOW Final  . APPearance 04/25/2014 CLEAR  CLEAR Final  . Specific Gravity, Urine 04/25/2014 1.021  1.005 - 1.030 Final  . pH 04/25/2014 5.5  5.0 - 8.0 Final  . Glucose, UA 04/25/2014 NEGATIVE  NEGATIVE mg/dL Final  . Hgb urine dipstick 04/25/2014 NEGATIVE  NEGATIVE Final  . Bilirubin Urine 04/25/2014 NEGATIVE  NEGATIVE Final  . Ketones, ur 04/25/2014 NEGATIVE  NEGATIVE mg/dL Final  . Protein, ur 04/25/2014 NEGATIVE  NEGATIVE mg/dL Final  . Urobilinogen, UA 04/25/2014 0.2  0.0 - 1.0 mg/dL Final  . Nitrite 04/25/2014 NEGATIVE  NEGATIVE  Final  . Leukocytes, UA 04/25/2014 SMALL* NEGATIVE Final  . MRSA, PCR 04/25/2014 NEGATIVE  NEGATIVE Final  . Staphylococcus aureus 04/25/2014 POSITIVE* NEGATIVE Final   Comment:                                 The Xpert SA Assay (FDA                          approved for NASAL specimens                          in patients over 39 years of age),                          is one component of                          a comprehensive surveillance                          program.  Test performance has                          been validated by [employer] for patients greater                          than or equal to 5 year old.                          It is not  intended                          to diagnose infection nor to                          guide or monitor treatment.  Marland Kitchen aPTT 04/25/2014 40* 24 - 37 seconds Final   Comment:                                 IF BASELINE aPTT IS ELEVATED,                          SUGGEST PATIENT RISK ASSESSMENT                          BE USED TO DETERMINE APPROPRIATE                          ANTICOAGULANT THERAPY.  . ABO/RH(D) 04/25/2014 O POS   Final  . Squamous Epithelial / LPF 04/25/2014 RARE  RARE Final  . WBC, UA 04/25/2014 3-6  <3 WBC/hpf Final  . RBC / HPF 04/25/2014 0-2  <3 RBC/hpf Final  . Bacteria, UA 04/25/2014 FEW* RARE Final  . Casts 04/25/2014 HYALINE CASTS* NEGATIVE Final     X-Rays:Dg Chest 2 View  04/25/2014   CLINICAL DATA:  Preoperative knee replacement; hypertension  EXAM: CHEST  2 VIEW  COMPARISON:  None.  FINDINGS: Lungs are clear. Heart size and pulmonary vascularity are normal. No adenopathy. No bone lesions.  IMPRESSION: No edema or consolidation.   Electronically Signed   By: Lowella Grip M.D.   On: 04/25/2014 14:24   Dg Knee 1-2 Views Left  04/25/2014   CLINICAL DATA:  Preop left knee replacement  EXAM: LEFT KNEE - 1-2 VIEW  COMPARISON:  None.  FINDINGS: No fracture or dislocation. No lytic or sclerotic osseous lesion. Tricompartmental osteoarthritis most severe in the medial femoral tibial compartment and patellofemoral compartment. No significant joint effusion.  IMPRESSION: Tricompartmental osteoarthritis of the left knee.   Electronically Signed   By: Kathreen Devoid   On: 04/25/2014 14:24   Dg Knee Left Port  05/02/2014   CLINICAL DATA:  Knee replacement .  EXAM: PORTABLE LEFT KNEE - 1-2 VIEW  COMPARISON:  04/25/2014.  FINDINGS: Total left knee replacement with good anatomic alignment. No acute abnormality.  IMPRESSION: Total knee replacement with good anatomic alignment.   Electronically Signed   By: Marcello Moores  Register   On: 05/02/2014 11:18    EKG:No orders found for this or  any previous visit.   Hospital Course: Jeanne Brooks is a 58 y.o. who was admitted to [facility]. They were brought to the operating room on 05/02/2014 and underwent Procedure(s): LEFT TOTAL KNEE ARTHROPLASTY.  Patient tolerated the procedure well and was later transferred to the recovery room and then to the orthopaedic floor for postoperative care.  They were given PO and IV analgesics for pain control following their surgery.  They were given 24 hours of postoperative antibiotics of  Anti-infectives   Start     Dose/Rate Route Frequency Ordered Stop   05/02/14 1600  ceFAZolin (ANCEF) IVPB 2 g/50 mL premix     2 g 100 mL/hr over 30 Minutes Intravenous Every 6 hours 05/02/14 1455 05/02/14 2258   05/02/14 0805  polymyxin B 500,000 Units, bacitracin 50,000 Units in sodium chloride irrigation 0.9 % 500 mL irrigation  Status:  Discontinued       As needed 05/02/14 0808 05/02/14 0954   05/02/14 0600  ceFAZolin (ANCEF) IVPB 2 g/50 mL premix     2 g 100 mL/hr over 30 Minutes Intravenous On call to O.R. 05/02/14 2671 05/02/14 0735     and started on DVT prophylaxis in the form of Xarelto, TED hose and SCD's.   PT and OT were ordered for total joint protocol.  Discharge planning consulted to help with postop disposition and equipment needs.  Patient had a good night on the evening of surgery.  They started to get up OOB with therapy on day one. Hemovac drain was pulled without difficulty.  Continued to work with therapy into day two. By day three, the patient had progressed with therapy and meeting their goals.  Incision was healing well.  Patient was seen in rounds and was ready to go home.   Diet: low sodium heart healthy Activity:WBAT Follow-up:in 10-14 days Disposition - Home Discharged Condition: good   Discharge Instructions   Call MD / Call 911    Complete by:  As directed   If you experience chest pain or shortness of breath, CALL 911 and be transported to the hospital emergency  room.  If you develope a fever above 101 F, pus (white drainage) or increased drainage or redness at the wound, or calf pain, call your surgeon's  office.     Constipation Prevention    Complete by:  As directed   Drink plenty of fluids.  Prune juice may be helpful.  You may use a stool softener, such as Colace (over the counter) 100 mg twice a day.  Use MiraLax (over the counter) for constipation as needed.     Diet - low sodium heart healthy    Complete by:  As directed      Discharge instructions    Complete by:  As directed   Maintain surgical dressing until follow up with Dr. Tonita Cong Keep wound dry     Increase activity slowly as tolerated    Complete by:  As directed             Medication List         amLODipine 5 MG tablet  Commonly known as:  NORVASC  Take 5 mg by mouth every morning.     buPROPion 300 MG 24 hr tablet  Commonly known as:  WELLBUTRIN XL  Take 300 mg by mouth every morning.     docusate sodium 100 MG capsule  Commonly known as:  COLACE  Take 1 capsule (100 mg total) by mouth 2 (two) times daily as needed for mild constipation.     FLUoxetine 40 MG capsule  Commonly known as:  PROZAC  Take 40 mg by mouth every morning.     hydrochlorothiazide 25 MG tablet  Commonly known as:  HYDRODIURIL  Take 25 mg by mouth every morning.     HYDROmorphone 2 MG tablet  Commonly known as:  DILAUDID  Take 1-2 tablets (2-4 mg total) by mouth every 6 (six) hours as needed for severe pain.     losartan 100 MG tablet  Commonly known as:  COZAAR  Take 100 mg by mouth every morning.     methocarbamol 500 MG tablet  Commonly known as:  ROBAXIN  Take 1 tablet (500 mg total) by mouth 3 (three) times daily between meals as needed for muscle spasms.     oxyCODONE-acetaminophen 7.5-325 MG per tablet  Commonly known as:  PERCOCET  Take 1 tablet by mouth every 4 (four) hours as needed for pain.     rivaroxaban 10 MG Tabs tablet  Commonly known as:  XARELTO  Take 1 tablet  (10 mg total) by mouth daily.     zolpidem 10 MG tablet  Commonly known as:  AMBIEN  Take 10 mg by mouth at bedtime as needed for sleep.           Follow-up Information   Follow up with BEANE,JEFFREY C, MD In 2 weeks.   Specialty:  Orthopedic Surgery   Contact information:   [address] [place] [address] [phone]       Signed: Arlee Muslim, PA-C Orthopaedic Surgery 05/05/2014, 7:56 AM

## 2014-05-21 ENCOUNTER — Ambulatory Visit (INDEPENDENT_AMBULATORY_CARE_PROVIDER_SITE_OTHER): Payer: PRIVATE HEALTH INSURANCE | Admitting: Physical Therapy

## 2014-05-21 ENCOUNTER — Ambulatory Visit: Payer: PRIVATE HEALTH INSURANCE | Admitting: Physical Therapy

## 2014-05-21 DIAGNOSIS — M25669 Stiffness of unspecified knee, not elsewhere classified: Secondary | ICD-10-CM

## 2014-05-21 DIAGNOSIS — Z96652 Presence of left artificial knee joint: Secondary | ICD-10-CM

## 2014-05-21 DIAGNOSIS — M6281 Muscle weakness (generalized): Secondary | ICD-10-CM

## 2014-05-21 DIAGNOSIS — Z471 Aftercare following joint replacement surgery: Secondary | ICD-10-CM

## 2014-05-23 ENCOUNTER — Encounter (INDEPENDENT_AMBULATORY_CARE_PROVIDER_SITE_OTHER): Payer: PRIVATE HEALTH INSURANCE | Admitting: Physical Therapy

## 2014-05-23 DIAGNOSIS — Z96652 Presence of left artificial knee joint: Secondary | ICD-10-CM

## 2014-05-23 DIAGNOSIS — Z471 Aftercare following joint replacement surgery: Secondary | ICD-10-CM

## 2014-05-23 DIAGNOSIS — M6281 Muscle weakness (generalized): Secondary | ICD-10-CM

## 2014-05-23 DIAGNOSIS — M25669 Stiffness of unspecified knee, not elsewhere classified: Secondary | ICD-10-CM

## 2014-05-28 ENCOUNTER — Encounter (INDEPENDENT_AMBULATORY_CARE_PROVIDER_SITE_OTHER): Payer: PRIVATE HEALTH INSURANCE | Admitting: Physical Therapy

## 2014-05-28 DIAGNOSIS — Z96652 Presence of left artificial knee joint: Secondary | ICD-10-CM

## 2014-05-28 DIAGNOSIS — R262 Difficulty in walking, not elsewhere classified: Secondary | ICD-10-CM

## 2014-05-28 DIAGNOSIS — Z471 Aftercare following joint replacement surgery: Secondary | ICD-10-CM

## 2014-05-28 DIAGNOSIS — M25669 Stiffness of unspecified knee, not elsewhere classified: Secondary | ICD-10-CM

## 2014-05-28 DIAGNOSIS — M6281 Muscle weakness (generalized): Secondary | ICD-10-CM

## 2014-05-30 ENCOUNTER — Encounter (INDEPENDENT_AMBULATORY_CARE_PROVIDER_SITE_OTHER): Payer: PRIVATE HEALTH INSURANCE | Admitting: Physical Therapy

## 2014-05-30 DIAGNOSIS — M25669 Stiffness of unspecified knee, not elsewhere classified: Secondary | ICD-10-CM

## 2014-05-30 DIAGNOSIS — Z471 Aftercare following joint replacement surgery: Secondary | ICD-10-CM

## 2014-05-30 DIAGNOSIS — M6281 Muscle weakness (generalized): Secondary | ICD-10-CM

## 2014-05-30 DIAGNOSIS — R262 Difficulty in walking, not elsewhere classified: Secondary | ICD-10-CM

## 2014-05-30 DIAGNOSIS — Z96652 Presence of left artificial knee joint: Secondary | ICD-10-CM

## 2014-06-04 ENCOUNTER — Encounter (INDEPENDENT_AMBULATORY_CARE_PROVIDER_SITE_OTHER): Payer: PRIVATE HEALTH INSURANCE | Admitting: Physical Therapy

## 2014-06-04 DIAGNOSIS — M25669 Stiffness of unspecified knee, not elsewhere classified: Secondary | ICD-10-CM

## 2014-06-04 DIAGNOSIS — Z96652 Presence of left artificial knee joint: Secondary | ICD-10-CM

## 2014-06-04 DIAGNOSIS — M6281 Muscle weakness (generalized): Secondary | ICD-10-CM

## 2014-06-04 DIAGNOSIS — R262 Difficulty in walking, not elsewhere classified: Secondary | ICD-10-CM

## 2014-06-04 DIAGNOSIS — Z471 Aftercare following joint replacement surgery: Secondary | ICD-10-CM

## 2014-06-06 ENCOUNTER — Encounter (INDEPENDENT_AMBULATORY_CARE_PROVIDER_SITE_OTHER): Payer: PRIVATE HEALTH INSURANCE | Admitting: Physical Therapy

## 2014-06-06 DIAGNOSIS — Z96652 Presence of left artificial knee joint: Secondary | ICD-10-CM

## 2014-06-06 DIAGNOSIS — R262 Difficulty in walking, not elsewhere classified: Secondary | ICD-10-CM

## 2014-06-06 DIAGNOSIS — M25669 Stiffness of unspecified knee, not elsewhere classified: Secondary | ICD-10-CM

## 2014-06-06 DIAGNOSIS — Z471 Aftercare following joint replacement surgery: Secondary | ICD-10-CM

## 2014-06-06 DIAGNOSIS — M6281 Muscle weakness (generalized): Secondary | ICD-10-CM

## 2014-06-11 ENCOUNTER — Encounter (INDEPENDENT_AMBULATORY_CARE_PROVIDER_SITE_OTHER): Payer: PRIVATE HEALTH INSURANCE | Admitting: Physical Therapy

## 2014-06-11 DIAGNOSIS — Z471 Aftercare following joint replacement surgery: Secondary | ICD-10-CM

## 2014-06-11 DIAGNOSIS — R262 Difficulty in walking, not elsewhere classified: Secondary | ICD-10-CM

## 2014-06-11 DIAGNOSIS — M6281 Muscle weakness (generalized): Secondary | ICD-10-CM

## 2014-06-11 DIAGNOSIS — M25669 Stiffness of unspecified knee, not elsewhere classified: Secondary | ICD-10-CM

## 2014-06-11 DIAGNOSIS — Z96652 Presence of left artificial knee joint: Secondary | ICD-10-CM

## 2014-06-13 ENCOUNTER — Encounter (INDEPENDENT_AMBULATORY_CARE_PROVIDER_SITE_OTHER): Payer: PRIVATE HEALTH INSURANCE | Admitting: Physical Therapy

## 2014-06-13 DIAGNOSIS — Z96652 Presence of left artificial knee joint: Secondary | ICD-10-CM

## 2014-06-13 DIAGNOSIS — R262 Difficulty in walking, not elsewhere classified: Secondary | ICD-10-CM

## 2014-06-13 DIAGNOSIS — M6281 Muscle weakness (generalized): Secondary | ICD-10-CM

## 2014-06-13 DIAGNOSIS — Z471 Aftercare following joint replacement surgery: Secondary | ICD-10-CM

## 2014-06-13 DIAGNOSIS — M25669 Stiffness of unspecified knee, not elsewhere classified: Secondary | ICD-10-CM

## 2014-06-18 ENCOUNTER — Encounter (INDEPENDENT_AMBULATORY_CARE_PROVIDER_SITE_OTHER): Payer: PRIVATE HEALTH INSURANCE | Admitting: Physical Therapy

## 2014-06-18 DIAGNOSIS — Z471 Aftercare following joint replacement surgery: Secondary | ICD-10-CM

## 2014-06-18 DIAGNOSIS — M6281 Muscle weakness (generalized): Secondary | ICD-10-CM

## 2014-06-18 DIAGNOSIS — Z96652 Presence of left artificial knee joint: Secondary | ICD-10-CM

## 2014-06-18 DIAGNOSIS — R262 Difficulty in walking, not elsewhere classified: Secondary | ICD-10-CM

## 2014-06-20 ENCOUNTER — Encounter: Payer: PRIVATE HEALTH INSURANCE | Admitting: Physical Therapy

## 2014-06-25 ENCOUNTER — Encounter (INDEPENDENT_AMBULATORY_CARE_PROVIDER_SITE_OTHER): Payer: PRIVATE HEALTH INSURANCE | Admitting: Physical Therapy

## 2014-06-25 DIAGNOSIS — Z471 Aftercare following joint replacement surgery: Secondary | ICD-10-CM

## 2014-06-25 DIAGNOSIS — Z96652 Presence of left artificial knee joint: Secondary | ICD-10-CM

## 2014-06-25 DIAGNOSIS — M6281 Muscle weakness (generalized): Secondary | ICD-10-CM

## 2014-06-25 DIAGNOSIS — M25669 Stiffness of unspecified knee, not elsewhere classified: Secondary | ICD-10-CM

## 2014-06-25 DIAGNOSIS — R262 Difficulty in walking, not elsewhere classified: Secondary | ICD-10-CM

## 2014-06-27 ENCOUNTER — Encounter (INDEPENDENT_AMBULATORY_CARE_PROVIDER_SITE_OTHER): Payer: PRIVATE HEALTH INSURANCE | Admitting: Physical Therapy

## 2014-06-27 DIAGNOSIS — R262 Difficulty in walking, not elsewhere classified: Secondary | ICD-10-CM

## 2014-06-27 DIAGNOSIS — M6281 Muscle weakness (generalized): Secondary | ICD-10-CM

## 2014-06-27 DIAGNOSIS — M25669 Stiffness of unspecified knee, not elsewhere classified: Secondary | ICD-10-CM

## 2014-06-27 DIAGNOSIS — Z471 Aftercare following joint replacement surgery: Secondary | ICD-10-CM

## 2014-06-27 DIAGNOSIS — Z96652 Presence of left artificial knee joint: Secondary | ICD-10-CM

## 2014-08-08 ENCOUNTER — Other Ambulatory Visit: Payer: Self-pay | Admitting: Nurse Practitioner

## 2014-08-08 DIAGNOSIS — N6320 Unspecified lump in the left breast, unspecified quadrant: Secondary | ICD-10-CM

## 2014-08-13 ENCOUNTER — Ambulatory Visit
Admission: RE | Admit: 2014-08-13 | Discharge: 2014-08-13 | Disposition: A | Discharge status: Home / Self Care | Payer: No Typology Code available for payment source | Source: Ambulatory Visit | Attending: Nurse Practitioner | Admitting: Nurse Practitioner

## 2014-08-13 DIAGNOSIS — N6320 Unspecified lump in the left breast, unspecified quadrant: Secondary | ICD-10-CM

## 2015-08-29 MED FILL — LOSARTAN POTASSIUM 100 MG T: 100 | 30 days supply | Qty: 30 | Fill #3

## 2015-08-29 MED FILL — AMLODIPINE BESYLATE 5 MG TA: 5 | 30 days supply | Qty: 30 | Fill #3

## 2015-08-29 MED FILL — HYDROCHLOROTHIAZIDE 25 MG T: 25 | 30 days supply | Qty: 30 | Fill #3

## 2015-08-29 MED FILL — BUPROPION HCL XL 300 MG TAB: 300 | 30 days supply | Qty: 30 | Fill #3

## 2015-08-29 MED FILL — FLUoxetine HCL 40 MG CAPS: 40 | 30 days supply | Qty: 30 | Fill #3

## 2015-09-01 MED FILL — CEPHALEXIN 500 MG CAPSULE: 500 | 1 days supply | Qty: 4 | Fill #0

## 2015-10-13 MED FILL — HYDROCHLOROTHIAZIDE 25 MG T: 25 | 30 days supply | Qty: 30 | Fill #4

## 2015-10-13 MED FILL — BUPROPION HCL XL 300 MG TAB: 300 | 30 days supply | Qty: 30 | Fill #4

## 2015-10-13 MED FILL — FLUoxetine HCL 40 MG CAPS: 40 | 30 days supply | Qty: 30 | Fill #4

## 2015-10-13 MED FILL — LOSARTAN POTASSIUM 100 MG T: 100 | 30 days supply | Qty: 30 | Fill #4

## 2015-10-13 MED FILL — AMLODIPINE BESYLATE 5 MG TA: 5 | 30 days supply | Qty: 30 | Fill #4

## 2015-11-21 MED FILL — BUPROPION HCL XL 300 MG TAB: 300 | 29 days supply | Qty: 29 | Fill #5

## 2015-11-21 MED FILL — FLUoxetine HCL 40 MG CAPS: 40 | 29 days supply | Qty: 29 | Fill #5

## 2015-11-21 MED FILL — LOSARTAN POTASSIUM 100 MG T: 100 | 29 days supply | Qty: 29 | Fill #5

## 2015-11-21 MED FILL — HYDROCHLOROTHIAZIDE 25 MG T: 25 | 29 days supply | Qty: 29 | Fill #5

## 2015-11-21 MED FILL — AMLODIPINE BESYLATE 5 MG TA: 5 | 29 days supply | Qty: 29 | Fill #5

## 2016-01-09 MED FILL — LOSARTAN POTASSIUM 100 MG T: 100 | 30 days supply | Qty: 30 | Fill #0

## 2016-01-09 MED FILL — HYDROCHLOROTHIAZIDE 25 MG T: 25 | 30 days supply | Qty: 30 | Fill #0

## 2016-01-09 MED FILL — FLUoxetine HCL 40 MG CAPS: 40 | 30 days supply | Qty: 30 | Fill #0

## 2016-01-09 MED FILL — AMLODIPINE BESYLATE 5 MG TA: 5 | 30 days supply | Qty: 30 | Fill #0

## 2016-01-09 MED FILL — BUPROPION HCL XL 300 MG TAB: 300 | 30 days supply | Qty: 30 | Fill #0

## 2016-03-01 MED FILL — HYDROCHLOROTHIAZIDE 25 MG T: 25 | 30 days supply | Qty: 30 | Fill #0

## 2016-03-01 MED FILL — BUPROPION HCL XL 300 MG TAB: 300 | 30 days supply | Qty: 30 | Fill #0

## 2016-03-01 MED FILL — FLUoxetine HCL 40 MG CAPS: 40 | 30 days supply | Qty: 30 | Fill #0

## 2016-03-01 MED FILL — AMLODIPINE BESYLATE 5 MG TA: 5 | 30 days supply | Qty: 30 | Fill #0

## 2016-03-01 MED FILL — LOSARTAN POTASSIUM 100 MG T: 100 | 30 days supply | Qty: 30 | Fill #0

## 2016-03-01 MED FILL — CEPHALEXIN 500 MG CAPSULE: 500 | 1 days supply | Qty: 4 | Fill #0

## 2016-04-16 MED FILL — LOSARTAN POTASSIUM 100 MG T: 100 | 30 days supply | Qty: 30 | Fill #0

## 2016-04-16 MED FILL — FLUoxetine HCL 40 MG CAPS: 40 | 30 days supply | Qty: 30 | Fill #0

## 2016-04-16 MED FILL — HYDROCHLOROTHIAZIDE 25 MG T: 25 | 30 days supply | Qty: 30 | Fill #0

## 2016-04-16 MED FILL — BUPROPION HCL XL 300 MG TAB: 300 | 30 days supply | Qty: 30 | Fill #0

## 2016-04-16 MED FILL — AMLODIPINE BESYLATE 5 MG TA: 5 | 30 days supply | Qty: 30 | Fill #0

## 2016-05-27 ENCOUNTER — Emergency Department (INDEPENDENT_AMBULATORY_CARE_PROVIDER_SITE_OTHER)
Admission: EM | Admit: 2016-05-27 | Discharge: 2016-05-27 | Disposition: A | Discharge status: Home / Self Care | Payer: Worker's Compensation | Source: Home / Self Care | Attending: Family Medicine | Admitting: Family Medicine

## 2016-05-27 ENCOUNTER — Encounter: Payer: Self-pay | Admitting: Emergency Medicine

## 2016-05-27 ENCOUNTER — Emergency Department (INDEPENDENT_AMBULATORY_CARE_PROVIDER_SITE_OTHER): Payer: Worker's Compensation

## 2016-05-27 DIAGNOSIS — S0093XA Contusion of unspecified part of head, initial encounter: Secondary | ICD-10-CM | POA: Diagnosis not present

## 2016-05-27 DIAGNOSIS — M4807 Spinal stenosis, lumbosacral region: Secondary | ICD-10-CM | POA: Diagnosis not present

## 2016-05-27 DIAGNOSIS — S335XXA Sprain of ligaments of lumbar spine, initial encounter: Secondary | ICD-10-CM

## 2016-05-27 NOTE — Discharge Instructions (Signed)
Apply ice pack for 20 to 30 minutes, 3 to 4 times daily  Continue until pain decreases.  May take Ibuprofen 200mg , 3 or 4 tabs every 8 hours with food.  Begin back range of motion and stretching exercises as tolerated.  Avoid heavy lifting.

## 2016-05-27 NOTE — ED Triage Notes (Signed)
This morning she was in an ACLS class leaned on a table which collapsed she fell hitting the base of her skull on the table and the floor, twisting her back. Denies LOC. Headache, bruise and knot on her skull, pain and right low back pain, iced skull and took 800 mgs ibuprofen. 6/10

## 2016-05-27 NOTE — ED Provider Notes (Signed)
[facility]    CSN: [id number] Arrival date & time: 05/27/16  1721     History   Chief Complaint Chief Complaint  Patient presents with  . Fall    HPI Jeanne Brooks is a 60 y.o. female.   This morning while in ACLS class at work, patient leaned on a table that suddenly collapsed.  She fell, hitting her occipital area on the table, and twisting her back.  She denies loss of consciousness, but complains of posterior headache and swelling of her occipital area.  She complains of right lower back pain without radiation.  She denies localizing neurologic symptoms.   The history is provided by the patient.  Head Injury  Location:  Occipital Time since incident:  7 hours Mechanism of injury: fall and work   Fall:    Fall occurred: while standing.   Impact surface: edge of table.   Point of impact:  Head   Entrapped after fall: no   Pain details:    Quality:  Aching   Severity:  Moderate   Duration:  7 hours   Timing:  Constant   Progression:  Improving Chronicity:  New Relieved by:  Nothing Exacerbated by: contact. Associated symptoms: headache   Associated symptoms: no blurred vision, no difficulty breathing, no disorientation, no double vision, no focal weakness, no hearing loss, no loss of consciousness, no memory loss, no nausea, no neck pain, no numbness, no seizures, no tinnitus and no vomiting   Risk factors: obesity   Back Pain  Location:  Lumbar spine Quality:  Aching Radiates to:  Does not radiate Pain severity:  Mild Pain is:  Same all the time Onset quality:  Sudden Duration:  7 hours Timing:  Constant Progression:  Unchanged Chronicity:  New Context: falling   Relieved by:  Nothing Worsened by:  Ambulation, movement and bending Ineffective treatments:  Ibuprofen Associated symptoms: headaches   Associated symptoms: no abdominal pain, no bladder incontinence, no bowel incontinence, no chest pain, no leg pain, no numbness, no paresthesias,  no pelvic pain, no perianal numbness, no tingling and no weakness     Past Medical History:  Diagnosis Date  . Arthritis   . Depression   . Hypertension     Patient Active Problem List   Diagnosis Date Noted  . 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 HAND SURGERY     LT hand  . MINI-LAPAROTOMY    . TENDON RELEASE    . TOTAL KNEE ARTHROPLASTY Left 05/02/2014   Procedure: LEFT TOTAL KNEE ARTHROPLASTY;  Surgeon: Javier Docker, MD;  Location: [facility];  Service: Orthopedics;  Laterality: Left;  . TUBAL LIGATION    . WISDOM TOOTH EXTRACTION      OB History    No data available       Home Medications    Prior to Admission medications   Medication Sig Start Date End Date Taking? Authorizing Provider  amLODipine (NORVASC) 5 MG tablet Take 5 mg by mouth every morning.    Historical Provider, MD  buPROPion (WELLBUTRIN XL) 300 MG 24 hr tablet Take 300 mg by mouth every morning.    Historical Provider, MD  docusate sodium (COLACE) 100 MG capsule Take 1 capsule (100 mg total) by mouth 2 (two) times daily as needed for mild constipation. 05/02/14   Jene Every, MD  FLUoxetine (PROZAC) 40 MG capsule Take 40 mg by mouth every morning.  Historical Provider, MD  hydrochlorothiazide (HYDRODIURIL) 25 MG tablet Take 25 mg by mouth every morning.     Historical Provider, MD  HYDROmorphone (DILAUDID) 2 MG tablet Take 1-2 tablets (2-4 mg total) by mouth every 6 (six) hours as needed for severe pain. 05/05/14   Durene Romans, MD  losartan (COZAAR) 100 MG tablet Take 100 mg by mouth every morning.     Historical Provider, MD  zolpidem (AMBIEN) 10 MG tablet Take 10 mg by mouth at bedtime as needed for sleep.    Historical Provider, MD    Family History Family History  Problem Relation Age of Onset  . Cancer Mother     breast  . Hypertension Mother   . Coronary artery disease Father   . COPD Father   . Hypertension Father     Social  History Social History  Substance Use Topics  . Smoking status: Never Smoker  . Smokeless tobacco: Never Used  . Alcohol use Yes     Comment: occasional      Allergies   Amoxicillin-pot clavulanate; Codeine; and Epinephrine   Review of Systems Review of Systems  HENT: Negative for hearing loss and tinnitus.   Eyes: Negative for blurred vision and double vision.  Cardiovascular: Negative for chest pain.  Gastrointestinal: Negative for abdominal pain, bowel incontinence, nausea and vomiting.  Genitourinary: Negative for bladder incontinence and pelvic pain.  Musculoskeletal: Positive for back pain. Negative for neck pain.  Neurological: Positive for headaches. Negative for tingling, focal weakness, seizures, loss of consciousness, weakness, numbness and paresthesias.  Psychiatric/Behavioral: Negative for memory loss.  All other systems reviewed and are negative.    Physical Exam Triage Vital Signs ED Triage Vitals  Enc Vitals Group     BP 05/27/16 1750 139/91     Pulse Rate 05/27/16 1750 82     Resp --      Temp 05/27/16 1750 97.8 F (36.6 C)     Temp Source 05/27/16 1750 Oral     SpO2 05/27/16 1750 95 %     Weight 05/27/16 1750 248 lb (112.5 kg)     Height 05/27/16 1750 5\' 7"  (1.702 m)     Head Circumference --      Peak Flow --      Pain Score 05/27/16 1754 6     Pain Loc --      Pain Edu? --      Excl. in GC? --    No data found.   Updated Vital Signs BP 139/91 (BP Location: Left Arm)   Pulse 82   Temp 97.8 F (36.6 C) (Oral)   Ht 5\' 7"  (1.702 m)   Wt 248 lb (112.5 kg)   SpO2 95%   BMI 38.84 kg/m   Visual Acuity Right Eye Distance:   Left Eye Distance:   Bilateral Distance:    Right Eye Near:   Left Eye Near:    Bilateral Near:     Physical Exam  Constitutional: She is oriented to person, place, and time. She appears well-developed and well-nourished. No distress.  HENT:  Head: Head is with contusion. Head is without raccoon's eyes, without  Battle's sign, without abrasion and without laceration.    Right Ear: Tympanic membrane, external ear and ear canal normal.  Left Ear: Tympanic membrane and external ear normal.  Nose: Nose normal.  Mouth/Throat: Oropharynx is clear and moist.  3cm diameter hematoma occipital area as noted on diagram.  Area tender to palpation without evidence depressed  skull fracture (no bony stepoffs).  Eyes: Conjunctivae and EOM are normal. Pupils are equal, round, and reactive to light.  Neck: Normal range of motion.  No neck tenderness to palpation   Cardiovascular: Normal heart sounds.   Pulmonary/Chest: Breath sounds normal. No respiratory distress. She exhibits no tenderness.  Abdominal: Soft. There is no tenderness.  Musculoskeletal: She exhibits no edema.       Lumbar back: She exhibits decreased range of motion and tenderness. She exhibits no swelling.       Back:  Back:   Can heel/toe walk and squat without difficulty.  Decreased forward flexion.  Tenderness in the midline and right paraspinous muscles from L3 to L5 Straight leg raising test is negative.  Sitting knee extension test is negative.  Strength and sensation in the lower extremities is normal.  Patellar and achilles reflexes are normal      Neurological: She is alert and oriented to person, place, and time. She has normal reflexes. No cranial nerve deficit. Coordination normal.  Skin: Skin is warm and dry.  Nursing note and vitals reviewed.    UC Treatments / Results  Labs (all labs ordered are listed, but only abnormal results are displayed) Labs Reviewed - No data to display  EKG  EKG Interpretation None       Radiology Dg Lumbar Spine Complete  Result Date: 05/27/2016 CLINICAL DATA:  Pain after fall earlier this morning. Right-sided back pain around L4. EXAM: LUMBAR SPINE - COMPLETE 4+ VIEW COMPARISON:  MRI abdomen August 16, 2005. FINDINGS: There is no evidence of lumbar spine fracture. Alignment is normal. Mild  disc space narrowing at L5-S1. Disc spaces are otherwise relatively maintained. No spondylolysis nor spondylolisthesis. Sclerosis of the facets from L3 through S1. Osteoarthritis of the sacroiliac joints. Phleboliths are seen in the pelvis bilaterally. The visualized bilateral hips are maintained. IMPRESSION: No acute osseous abnormality. Slight disc space narrowing at L5-S1. Mild degenerative facet sclerosis L3 through S1. Electronically Signed   By: Tollie Ethavid  Kwon M.D.   On: 05/27/2016 18:57    Procedures Procedures (including critical care time)  Medications Ordered in UC Medications - No data to display   Initial Impression / Assessment and Plan / UC Course  I have reviewed the triage vital signs and the nursing notes.  Pertinent labs & imaging results that were available during my care of the patient were reviewed by me and considered in my medical decision making (see chart for details).  Clinical Course  Apply ice pack for 20 to 30 minutes, 3 to 4 times daily  Continue until pain decreases.  May take Ibuprofen 200mg , 3 or 4 tabs every 8 hours with food.  Begin back range of motion and stretching exercises as tolerated.  Avoid heavy lifting. Discussed head injury precautions. Followup with [facility] in one week. Work restrictions:  No heavy lifting.     Final Clinical Impressions(s) / UC Diagnoses   Final diagnoses:  Contusion of head, initial encounter  Low back sprain, initial encounter    New Prescriptions New Prescriptions   No medications on file     Lattie HawStephen A Aleksei Goodlin, MD 06/03/16 1357

## 2016-06-04 MED FILL — AMLODIPINE BESYLATE 5 MG TA: 5 | 30 days supply | Qty: 30 | Fill #1

## 2016-06-04 MED FILL — BUPROPION HCL XL 300 MG TAB: 300 | 30 days supply | Qty: 30 | Fill #1

## 2016-06-04 MED FILL — HYDROCHLOROTHIAZIDE 25 MG T: 25 | 30 days supply | Qty: 30 | Fill #1

## 2016-06-04 MED FILL — FLUoxetine HCL 40 MG CAPS: 40 | 30 days supply | Qty: 30 | Fill #1

## 2016-06-04 MED FILL — LOSARTAN POTASSIUM 100 MG T: 100 | 30 days supply | Qty: 30 | Fill #1

## 2016-07-23 MED FILL — FLUoxetine HCL 40 MG CAPS: 40 | 30 days supply | Qty: 30 | Fill #2

## 2016-07-23 MED FILL — HYDROCHLOROTHIAZIDE 25 MG T: 25 | 30 days supply | Qty: 30 | Fill #2

## 2016-07-23 MED FILL — BUPROPION HCL XL 300 MG TAB: 300 | 30 days supply | Qty: 30 | Fill #2

## 2016-07-23 MED FILL — AMLODIPINE BESYLATE 5 MG TA: 5 | 30 days supply | Qty: 30 | Fill #2

## 2016-07-23 MED FILL — LOSARTAN POTASSIUM 100 MG T: 100 | 30 days supply | Qty: 30 | Fill #2

## 2016-09-10 MED FILL — LOSARTAN POTASSIUM 100 MG T: 100 | 30 days supply | Qty: 30 | Fill #3

## 2016-09-10 MED FILL — HYDROCHLOROTHIAZIDE 25 MG T: 25 | 30 days supply | Qty: 30 | Fill #3

## 2016-09-10 MED FILL — BUPROPION HCL XL 300 MG TAB: 300 | 30 days supply | Qty: 30 | Fill #3

## 2016-09-10 MED FILL — AMLODIPINE BESYLATE 5 MG TA: 5 | 30 days supply | Qty: 30 | Fill #3

## 2016-09-10 MED FILL — FLUoxetine HCL 40 MG CAPS: 40 | 30 days supply | Qty: 30 | Fill #3

## 2016-11-04 MED FILL — LOSARTAN POTASSIUM 100 MG T: 100 | 30 days supply | Qty: 30 | Fill #4

## 2016-11-04 MED FILL — AMLODIPINE BESYLATE 5 MG TA: 5 | 30 days supply | Qty: 30 | Fill #4

## 2016-11-04 MED FILL — FLUoxetine HCL 40 MG CAPS: 40 | 30 days supply | Qty: 30 | Fill #4

## 2016-11-04 MED FILL — HYDROCHLOROTHIAZIDE 25 MG T: 25 | 30 days supply | Qty: 30 | Fill #4

## 2016-11-04 MED FILL — CEPHALEXIN 500 MG CAPSULE: 500 | 1 days supply | Qty: 4 | Fill #1

## 2016-11-04 MED FILL — BUPROPION HCL XL 300 MG TAB: 300 | 30 days supply | Qty: 30 | Fill #4

## 2016-12-08 MED FILL — FLUoxetine HCL 20 MG CAPS: 20 | 30 days supply | Qty: 90 | Fill #0

## 2016-12-17 MED FILL — HYDROCHLOROTHIAZIDE 25 MG T: 25 | 30 days supply | Qty: 30 | Fill #5

## 2016-12-17 MED FILL — LOSARTAN POTASSIUM 100 MG T: 100 | 30 days supply | Qty: 30 | Fill #5

## 2016-12-17 MED FILL — BUPROPION HCL XL 300 MG TAB: 300 | 30 days supply | Qty: 30 | Fill #5

## 2016-12-17 MED FILL — AMLODIPINE BESYLATE 5 MG TA: 5 | 30 days supply | Qty: 30 | Fill #5

## 2017-01-21 MED FILL — FLUoxetine HCL 20 MG CAPS: 20 | 30 days supply | Qty: 90 | Fill #1

## 2017-01-21 MED FILL — BUPROPION HCL XL 300 MG TAB: 300 | 30 days supply | Qty: 30 | Fill #0

## 2017-01-21 MED FILL — HYDROCHLOROTHIAZIDE 25 MG T: 25 | 30 days supply | Qty: 30 | Fill #0

## 2017-01-21 MED FILL — LOSARTAN POTASSIUM 100 MG T: 100 | 30 days supply | Qty: 30 | Fill #0

## 2017-01-21 MED FILL — AMLODIPINE BESYLATE 5 MG TA: 5 | 30 days supply | Qty: 30 | Fill #0

## 2017-03-18 MED FILL — HYDROCHLOROTHIAZIDE 25 MG T: 25 | 30 days supply | Qty: 30 | Fill #1

## 2017-03-18 MED FILL — LOSARTAN POTASSIUM 100 MG T: 100 | 30 days supply | Qty: 30 | Fill #1

## 2017-03-18 MED FILL — FLUoxetine HCL 20 MG CAPS: 20 | 30 days supply | Qty: 90 | Fill #2

## 2017-03-18 MED FILL — AMLODIPINE BESYLATE 5 MG TA: 5 | 30 days supply | Qty: 30 | Fill #1

## 2017-03-18 MED FILL — BUPROPION HCL XL 300 MG TAB: 300 | 30 days supply | Qty: 30 | Fill #1

## 2017-05-09 MED FILL — HYDROCHLOROTHIAZIDE 25 MG T: 25 | 30 days supply | Qty: 30 | Fill #2

## 2017-05-09 MED FILL — LOSARTAN POTASSIUM 100 MG T: 100 | 30 days supply | Qty: 30 | Fill #2

## 2017-05-09 MED FILL — FLUoxetine HCL 20 MG CAPS: 20 | 30 days supply | Qty: 90 | Fill #0

## 2017-05-09 MED FILL — AMLODIPINE BESYLATE 5 MG TA: 5 | 30 days supply | Qty: 30 | Fill #2

## 2017-05-09 MED FILL — BUPROPION HCL XL 300 MG TAB: 300 | 30 days supply | Qty: 30 | Fill #2

## 2017-05-16 MED FILL — CEPHALEXIN 500 MG CAPSULE: 500 | 1 days supply | Qty: 6 | Fill #0

## 2017-05-26 DIAGNOSIS — Z1231 Encounter for screening mammogram for malignant neoplasm of breast: Secondary | ICD-10-CM | POA: Diagnosis not present

## 2017-05-26 DIAGNOSIS — Z803 Family history of malignant neoplasm of breast: Secondary | ICD-10-CM | POA: Diagnosis not present

## 2017-05-26 DIAGNOSIS — Z9189 Other specified personal risk factors, not elsewhere classified: Secondary | ICD-10-CM | POA: Diagnosis not present

## 2017-06-06 DIAGNOSIS — Z1231 Encounter for screening mammogram for malignant neoplasm of breast: Secondary | ICD-10-CM | POA: Diagnosis not present

## 2017-06-06 DIAGNOSIS — Z9189 Other specified personal risk factors, not elsewhere classified: Secondary | ICD-10-CM | POA: Diagnosis not present

## 2017-06-06 DIAGNOSIS — Z803 Family history of malignant neoplasm of breast: Secondary | ICD-10-CM | POA: Diagnosis not present

## 2017-06-16 DIAGNOSIS — Z9189 Other specified personal risk factors, not elsewhere classified: Secondary | ICD-10-CM | POA: Diagnosis not present

## 2017-06-16 DIAGNOSIS — Z803 Family history of malignant neoplasm of breast: Secondary | ICD-10-CM | POA: Diagnosis not present

## 2017-06-16 DIAGNOSIS — R928 Other abnormal and inconclusive findings on diagnostic imaging of breast: Secondary | ICD-10-CM | POA: Diagnosis not present

## 2017-06-16 DIAGNOSIS — N6489 Other specified disorders of breast: Secondary | ICD-10-CM | POA: Diagnosis not present

## 2017-06-28 MED FILL — AMLODIPINE BESYLATE 5 MG TA: 5 | 30 days supply | Qty: 30 | Fill #3

## 2017-06-28 MED FILL — HYDROCHLOROTHIAZIDE 25 MG T: 25 | 30 days supply | Qty: 30 | Fill #3

## 2017-06-28 MED FILL — BUPROPION HCL XL 300 MG TAB: 300 | 30 days supply | Qty: 30 | Fill #3

## 2017-06-28 MED FILL — FLUoxetine HCL 20 MG CAPS: 20 | 30 days supply | Qty: 90 | Fill #1

## 2017-06-28 MED FILL — LOSARTAN POTASSIUM 100 MG T: 100 | 30 days supply | Qty: 30 | Fill #3

## 2017-08-17 MED FILL — LOSARTAN POTASSIUM 100 MG T: 100 | 30 days supply | Qty: 30 | Fill #0

## 2017-08-17 MED FILL — BUPROPION HCL XL 300 MG TAB: 300 | 30 days supply | Qty: 30 | Fill #0

## 2017-08-17 MED FILL — HYDROCHLOROTHIAZIDE 25 MG T: 25 | 30 days supply | Qty: 30 | Fill #0

## 2017-08-17 MED FILL — AMLODIPINE BESYLATE 5 MG TA: 5 | 30 days supply | Qty: 30 | Fill #0

## 2017-08-17 MED FILL — FLUoxetine HCL 20 MG CAPS: 20 | 30 days supply | Qty: 90 | Fill #2

## 2017-10-07 MED FILL — FLUoxetine HCL 20 MG CAPS: 20 | 30 days supply | Qty: 90 | Fill #0

## 2017-10-07 MED FILL — LOSARTAN POTASSIUM 100 MG T: 100 | 30 days supply | Qty: 30 | Fill #1

## 2017-10-07 MED FILL — AMLODIPINE BESYLATE 5 MG TA: 5 | 30 days supply | Qty: 30 | Fill #1

## 2017-10-07 MED FILL — BUPROPION HCL XL 300 MG TAB: 300 | 30 days supply | Qty: 30 | Fill #1

## 2017-10-07 MED FILL — HYDROCHLOROTHIAZIDE 25 MG T: 25 | 30 days supply | Qty: 30 | Fill #1

## 2017-11-15 MED FILL — CEPHALEXIN 500 MG CAPSULE: 500 | 1 days supply | Qty: 6 | Fill #1

## 2017-11-28 MED FILL — CEPHALEXIN 500 MG CAPSULE: 500 | 1 days supply | Qty: 6 | Fill #2

## 2017-11-28 MED FILL — AMLODIPINE BESYLATE 5 MG TA: 5 | 30 days supply | Qty: 30 | Fill #2

## 2017-11-28 MED FILL — FLUoxetine HCL 20 MG CAPS: 20 | 30 days supply | Qty: 90 | Fill #1

## 2017-11-28 MED FILL — HYDROCHLOROTHIAZIDE 25 MG T: 25 | 30 days supply | Qty: 30 | Fill #2

## 2017-11-28 MED FILL — LOSARTAN POTASSIUM 100 MG T: 100 | 30 days supply | Qty: 30 | Fill #2

## 2017-11-28 MED FILL — BUPROPION HCL XL 300 MG TAB: 300 | 30 days supply | Qty: 30 | Fill #2

## 2018-01-03 DIAGNOSIS — F4310 Post-traumatic stress disorder, unspecified: Secondary | ICD-10-CM | POA: Diagnosis not present

## 2018-01-03 DIAGNOSIS — R69 Illness, unspecified: Secondary | ICD-10-CM | POA: Diagnosis not present

## 2018-01-03 MED FILL — ARIPiprazole 2 MG TABS: 2 | 30 days supply | Qty: 25 | Fill #0

## 2018-01-04 MED FILL — LORazepam 0.5 MG TABS: 0.5 | 30 days supply | Qty: 60 | Fill #0

## 2018-01-11 MED FILL — HYDROCHLOROTHIAZIDE 25 MG T: 25 | 30 days supply | Qty: 30 | Fill #3

## 2018-01-11 MED FILL — LOSARTAN POTASSIUM 100 MG T: 100 | 30 days supply | Qty: 30 | Fill #3

## 2018-01-11 MED FILL — FLUoxetine HCL 20 MG CAPS: 20 | 30 days supply | Qty: 90 | Fill #2

## 2018-01-11 MED FILL — AMLODIPINE BESYLATE 5 MG TA: 5 | 30 days supply | Qty: 30 | Fill #3

## 2018-01-30 DIAGNOSIS — R69 Illness, unspecified: Secondary | ICD-10-CM | POA: Diagnosis not present

## 2018-01-30 DIAGNOSIS — F4310 Post-traumatic stress disorder, unspecified: Secondary | ICD-10-CM | POA: Diagnosis not present

## 2018-01-30 MED FILL — ARIPiprazole 5 MG TABS: 5 | 30 days supply | Qty: 30 | Fill #0

## 2018-02-05 ENCOUNTER — Emergency Department (INDEPENDENT_AMBULATORY_CARE_PROVIDER_SITE_OTHER)
Admission: EM | Admit: 2018-02-05 | Discharge: 2018-02-05 | Disposition: A | Discharge status: Home / Self Care | Payer: 59 | Source: Home / Self Care | Attending: Family Medicine | Admitting: Family Medicine

## 2018-02-05 ENCOUNTER — Encounter: Payer: Self-pay | Admitting: Emergency Medicine

## 2018-02-05 DIAGNOSIS — J069 Acute upper respiratory infection, unspecified: Secondary | ICD-10-CM

## 2018-02-05 DIAGNOSIS — R05 Cough: Secondary | ICD-10-CM

## 2018-02-05 DIAGNOSIS — R053 Chronic cough: Secondary | ICD-10-CM

## 2018-02-05 MED ORDER — BENZONATATE 100 MG PO CAPS: 100.0000 mg | ORAL_CAPSULE | Freq: Three times a day (TID) | ORAL | 0 refills | Status: DC

## 2018-02-05 MED ORDER — AZITHROMYCIN 250 MG PO TABS: 250.0000 mg | ORAL_TABLET | Freq: Every day | ORAL | 0 refills | Status: DC

## 2018-02-05 NOTE — ED Triage Notes (Signed)
Patient c/o that she's been sick x 1 month, wheezing with productive cough, head congestion, no runny nose, some right ear discomfort.  Patient has been using saline nasal spray.

## 2018-02-05 NOTE — Discharge Instructions (Signed)
°  Please take antibiotics as prescribed and be sure to complete entire course even if you start to feel better to ensure infection does not come back. ° °Please follow up with family medicine in 1 week if not improving, sooner if significantly worsening. °

## 2018-02-05 NOTE — ED Provider Notes (Signed)
[facility]    CSN: [id number] Arrival date & time: 02/05/18  1112     History   Chief Complaint Chief Complaint  Patient presents with  . Cough    HPI Jeanne Brooks is a 62 y.o. female.   HPI Jeanne Brooks is a 62 y.o. female presenting to UC with c/o persistent cough for about 1 month with intermittent productive cough and wheeze, head congestion and Right ear fullness.  She has tried plain guaifenesin and saline nasal spray with minimal relief.  Denies fever, chills, n/v/d. No known sick contacts but she has been traveling a lot to help care for a sick family member who recently died.     Past Medical History:  Diagnosis Date  . Arthritis   . Depression   . Hypertension     Patient Active Problem List   Diagnosis Date Noted  . 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 HAND SURGERY     LT hand  . MINI-LAPAROTOMY    . TENDON RELEASE    . 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 TUBAL LIGATION    . WISDOM TOOTH EXTRACTION      OB History   None      Home Medications    Prior to Admission medications   Medication Sig Start Date End Date Taking? Authorizing Provider  amLODipine (NORVASC) 5 MG tablet Take 5 mg by mouth every morning.    [provider]  azithromycin (ZITHROMAX) 250 MG tablet Take 1 tablet (250 mg total) by mouth daily. Take first 2 tablets together, then 1 every day until finished. 02/05/18   Lurene ShadowPhelps, Emorie Mcfate O, PA-C  benzonatate (TESSALON) 100 MG capsule Take 1-2 capsules (100-200 mg total) by mouth every 8 (eight) hours. 02/05/18   Lurene ShadowPhelps, Ruslan Mccabe O, PA-C  FLUoxetine (PROZAC) 40 MG capsule Take 40 mg by mouth every morning.     [provider]  hydrochlorothiazide (HYDRODIURIL) 25 MG tablet Take 25 mg by mouth every morning.     [provider]  losartan (COZAAR) 100 MG tablet Take 100 mg by mouth every morning.     [provider]  zolpidem (AMBIEN) 10 MG tablet Take 10 mg by mouth at bedtime as needed for sleep.    [provider]    Family History Family History  Problem Relation Age of Onset  . Cancer Mother        breast  . Hypertension Mother   . Coronary artery disease Father   . COPD Father   . Hypertension Father     Social History Social History   Tobacco Use  . Smoking status: Never Smoker  . Smokeless tobacco: Never Used  Substance Use Topics  . Alcohol use: Yes    Comment: occasional   . Drug use: No     Allergies   Epinephrine; Amoxicillin-pot clavulanate; and Codeine   Review of Systems Review of Systems  Constitutional: Negative for chills and fever.  HENT: Positive for congestion, ear pain (Right) and rhinorrhea. Negative for sore throat, trouble swallowing and voice change.   Respiratory: Positive for cough and wheezing. Negative for shortness of breath.   Cardiovascular: Negative for chest pain and palpitations.  Gastrointestinal: Negative for abdominal pain, diarrhea, nausea and vomiting.  Musculoskeletal: Negative for arthralgias,  back pain and myalgias.  Skin: Negative for rash.     Physical Exam Triage Vital Signs ED Triage Vitals [02/05/18 1129]  Enc Vitals Group     BP 125/83     Pulse Rate 79     Resp      Temp 98.1 F (36.7 C)     Temp Source Oral     SpO2 97 %     Weight 237 lb 8 oz (107.7 kg)     Height 5\' 5"  (1.651 m)     Head Circumference      Peak Flow      Pain Score 0     Pain Loc      Pain Edu?      Excl. in [place]?    No data found.  Updated Vital Signs BP 125/83 (BP Location: Right Arm)   Pulse 79   Temp 98.1 F (36.7 C) (Oral)   Ht 5\' 5"  (1.651 m)   Wt 237 lb 8 oz (107.7 kg)   SpO2 97%   BMI 39.52 kg/m   Visual Acuity Right Eye Distance:   Left Eye Distance:   Bilateral Distance:    Right Eye Near:   Left  Eye Near:    Bilateral Near:     Physical Exam  Constitutional: She is oriented to person, place, and time. She appears well-developed and well-nourished. No distress.  HENT:  Head: Normocephalic and atraumatic.  Right Ear: Tympanic membrane normal.  Left Ear: Tympanic membrane normal.  Nose: Mucosal edema present.  Mouth/Throat: Uvula is midline and mucous membranes are normal. Posterior oropharyngeal erythema present. No oropharyngeal exudate, posterior oropharyngeal edema or tonsillar abscesses.  Eyes: EOM are normal.  Neck: Normal range of motion. Neck supple.  Cardiovascular: Normal rate and regular rhythm.  Pulmonary/Chest: Effort normal and breath sounds normal. No stridor. No respiratory distress. She has no wheezes. She has no rales.  Musculoskeletal: Normal range of motion.  Lymphadenopathy:    She has no cervical adenopathy.  Neurological: She is alert and oriented to person, place, and time.  Skin: Skin is warm and dry. She is not diaphoretic.  Psychiatric: She has a normal mood and affect. Her behavior is normal.  Nursing note and vitals reviewed.    UC Treatments / Results  Labs (all labs ordered are listed, but only abnormal results are displayed) Labs Reviewed - No data to display  EKG None  Radiology No results found.  Procedures Procedures (including critical care time)  Medications Ordered in UC Medications - No data to display  Initial Impression / Assessment and Plan / UC Course  I have reviewed the triage vital signs and the nursing notes.  Pertinent labs & imaging results that were available during my care of the patient were reviewed by me and considered in my medical decision making (see chart for details).     Due to persistence of symptoms, will cover for bacterial infection. Home care instructions provided.   Final Clinical Impressions(s) / UC Diagnoses   Final diagnoses:  Upper respiratory tract infection, unspecified type    Persistent cough for 3 weeks or longer     Discharge Instructions      Please take antibiotics as prescribed and be sure to complete entire course even if you start to feel better to ensure infection does not come back.  Please follow up with family medicine in 1 week if not improving, sooner if significantly worsening.     ED Prescriptions  Medication Sig Dispense Auth. Provider   azithromycin (ZITHROMAX) 250 MG tablet Take 1 tablet (250 mg total) by mouth daily. Take first 2 tablets together, then 1 every day until finished. 6 tablet Doroteo Glassman, Rashana Andrew O, PA-C   benzonatate (TESSALON) 100 MG capsule Take 1-2 capsules (100-200 mg total) by mouth every 8 (eight) hours. 21 capsule Lurene Shadow, PA-C     Controlled Substance Prescriptions [place] Controlled Substance Registry consulted? Not Applicable   Rolla Plate 02/05/18 1341

## 2018-02-06 MED FILL — BENZONATATE 100 MG CAPSULE: 100 | 3 days supply | Qty: 21 | Fill #0

## 2018-02-06 MED FILL — AZITHROMYCIN 250 MG TABLET: 250 | 5 days supply | Qty: 6 | Fill #0

## 2018-02-07 ENCOUNTER — Telehealth: Payer: Self-pay

## 2018-02-07 NOTE — Telephone Encounter (Signed)
Spoke with patient, feeling much better.  Will follow up as needed. 

## 2018-02-08 MED FILL — HYDROCHLOROTHIAZIDE 25 MG T: 25 | 30 days supply | Qty: 30 | Fill #0

## 2018-02-08 MED FILL — AMLODIPINE BESYLATE 5 MG TA: 5 | 30 days supply | Qty: 30 | Fill #0

## 2018-02-08 MED FILL — LORazepam 0.5 MG TABS: 0.5 | 30 days supply | Qty: 60 | Fill #1

## 2018-02-08 MED FILL — FLUoxetine HCL 20 MG CAPS: 20 | 30 days supply | Qty: 90 | Fill #0

## 2018-02-08 MED FILL — LOSARTAN POTASSIUM 100 MG T: 100 | 30 days supply | Qty: 30 | Fill #0

## 2018-02-10 MED FILL — CEFDINIR 300 MG CAPS: 300 | 10 days supply | Qty: 20 | Fill #0

## 2018-03-14 MED FILL — ARIPiprazole 5 MG TABS: 5 | 30 days supply | Qty: 30 | Fill #1

## 2018-03-27 MED FILL — FLUoxetine HCL 20 MG CAPS: 20 | 30 days supply | Qty: 90 | Fill #1

## 2018-03-27 MED FILL — LOSARTAN POTASSIUM 100 MG T: 100 | 30 days supply | Qty: 30 | Fill #1

## 2018-03-27 MED FILL — AMLODIPINE BESYLATE 5 MG TA: 5 | 30 days supply | Qty: 30 | Fill #1

## 2018-03-29 MED FILL — HYDROCHLOROTHIAZIDE 25 MG T: 25 | 30 days supply | Qty: 30 | Fill #1

## 2018-05-01 MED FILL — ARIPiprazole 5 MG TABS: 5 | 30 days supply | Qty: 30 | Fill #2

## 2018-05-12 MED FILL — HYDROCHLOROTHIAZIDE 25 MG T: 25 | 30 days supply | Qty: 30 | Fill #2

## 2018-05-12 MED FILL — LOSARTAN POTASSIUM 100 MG T: 100 | 30 days supply | Qty: 30 | Fill #2

## 2018-05-12 MED FILL — AMLODIPINE BESYLATE 5 MG TA: 5 | 30 days supply | Qty: 30 | Fill #2

## 2018-05-12 MED FILL — FLUoxetine HCL 20 MG CAPS: 20 | 30 days supply | Qty: 90 | Fill #2

## 2018-06-06 MED FILL — CEPHALEXIN 500 MG CAPSULE: 500 | 6 days supply | Qty: 24 | Fill #0

## 2018-06-27 MED FILL — FLUoxetine HCL 20 MG CAPS: 20 | 30 days supply | Qty: 90 | Fill #0

## 2018-06-27 MED FILL — AMLODIPINE BESYLATE 5 MG TA: 5 | 30 days supply | Qty: 30 | Fill #3

## 2018-06-27 MED FILL — LOSARTAN POTASSIUM 100 MG T: 100 | 30 days supply | Qty: 30 | Fill #3

## 2018-06-27 MED FILL — HYDROCHLOROTHIAZIDE 25 MG T: 25 | 30 days supply | Qty: 30 | Fill #3

## 2018-08-25 MED FILL — FLUoxetine HCL 20 MG CAPS: 20 | 30 days supply | Qty: 90 | Fill #1

## 2018-08-25 MED FILL — ARIPiprazole 5 MG TABS: 5 | 30 days supply | Qty: 30 | Fill #0

## 2018-08-25 MED FILL — HYDROCHLOROTHIAZIDE 25 MG T: 25 | 30 days supply | Qty: 30 | Fill #0

## 2018-08-25 MED FILL — AMLODIPINE BESYLATE 5 MG TA: 5 | 30 days supply | Qty: 30 | Fill #0

## 2018-08-25 MED FILL — LOSARTAN POTASSIUM 100 MG T: 100 | 30 days supply | Qty: 30 | Fill #0

## 2018-09-05 ENCOUNTER — Emergency Department (INDEPENDENT_AMBULATORY_CARE_PROVIDER_SITE_OTHER)
Admission: EM | Admit: 2018-09-05 | Discharge: 2018-09-05 | Disposition: A | Discharge status: Home / Self Care | Payer: 59 | Source: Home / Self Care | Attending: Family Medicine | Admitting: Family Medicine

## 2018-09-05 ENCOUNTER — Other Ambulatory Visit: Payer: Self-pay

## 2018-09-05 DIAGNOSIS — J209 Acute bronchitis, unspecified: Secondary | ICD-10-CM | POA: Diagnosis not present

## 2018-09-05 MED ORDER — PREDNISONE 20 MG PO TABS: ORAL_TABLET | ORAL | 0 refills | Status: DC

## 2018-09-05 MED ORDER — AZITHROMYCIN 250 MG PO TABS: 250.0000 mg | ORAL_TABLET | Freq: Every day | ORAL | 0 refills | Status: AC

## 2018-09-05 MED ORDER — BENZONATATE 200 MG PO CAPS: ORAL_CAPSULE | ORAL | 0 refills | Status: DC

## 2018-09-05 MED FILL — predniSONE 20 MG TABS: 20 | 8 days supply | Qty: 12 | Fill #0

## 2018-09-05 MED FILL — AZITHROMYCIN 250 MG TABLET: 250 | 5 days supply | Qty: 6 | Fill #0

## 2018-09-05 MED FILL — BENZONATATE 200 MG CAPS: 200 | 7 days supply | Qty: 15 | Fill #0

## 2018-09-05 NOTE — ED Provider Notes (Signed)
[facility]    CSN: [id number] Arrival date & time: 09/05/18  1236     History   Chief Complaint Cough  HPI Jeanne Brooks is a 63 y.o. female.   Patient developed a cold-like illness about 3.5 weeks ago, recovering in about one week.  Nine days ago she developed a recurrent URI with initial sinus congestion, sweats, fatigue, myalgias and cough.  Her cough has become worse at night, with wheezing and occasional shortness of breath.  She denies pleuritic pain.  She continues to have sweats. She has a history of seasonal rhinitis.  The history is provided by the patient.    Past Medical History:  Diagnosis Date  . Arthritis   . Depression   . Hypertension     Patient Active Problem List   Diagnosis Date Noted  . 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 HAND SURGERY     LT hand  . MINI-LAPAROTOMY    . TENDON RELEASE    . 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 TUBAL LIGATION    . WISDOM TOOTH EXTRACTION      OB History   No obstetric history on file.      Home Medications    Prior to Admission medications   Medication Sig Start Date End Date Taking? Authorizing Provider  amLODipine (NORVASC) 5 MG tablet Take 5 mg by mouth every morning.    [provider]  ARIPiprazole (ABILIFY) 5 MG tablet  08/25/18   [provider]  azithromycin (ZITHROMAX) 250 MG tablet Take 1 tablet (250 mg total) by mouth daily. Take first 2 tablets together, then 1 every day until finished. 09/05/18   Lattie HawBeese, Chaslyn Eisen A, MD  benzonatate (TESSALON) 200 MG capsule Take one cap by mouth at bedtime as needed for cough.  May repeat in 4 to 6 hours 09/05/18   Lattie HawBeese, Loyalty Brashier A, MD  FLUoxetine (PROZAC) 40 MG capsule Take 40 mg by mouth every morning.     [provider]    hydrochlorothiazide (HYDRODIURIL) 25 MG tablet Take 25 mg by mouth every morning.     [provider]  losartan (COZAAR) 100 MG tablet Take 100 mg by mouth every morning.     [provider]  predniSONE (DELTASONE) 20 MG tablet Take one tab by mouth twice daily for 4 days, then one daily. Take with food. 09/05/18   Lattie HawBeese, Armani Brar A, MD  zolpidem (AMBIEN) 10 MG tablet Take 10 mg by mouth at bedtime as needed for sleep.    [provider]    Family History Family History  Problem Relation Age of Onset  . Cancer Mother        breast  . Hypertension Mother   . Coronary artery disease Father   . COPD Father   . Hypertension Father     Social History Social History   Tobacco Use  . Smoking status: Never Smoker  . Smokeless tobacco: Never Used  Substance Use Topics  . Alcohol use: Yes    Comment: occasional   . Drug use: No     Allergies   Epinephrine; Amoxicillin-pot clavulanate; and Codeine   Review of Systems Review of Systems No sore throat + cough No pleuritic pain + wheezing + nasal congestion + post-nasal drainage + sinus pain/pressure  No itchy/red eyes No earache No hemoptysis + SOB No fever, + chills/sweats No nausea No vomiting No abdominal pain No diarrhea No urinary symptoms No skin rash + fatigue + myalgias No headache Used OTC meds without relief   Physical Exam Triage Vital Signs ED Triage Vitals  Enc Vitals Group     BP 09/05/18 1315 (!) 152/88     Pulse Rate 09/05/18 1315 73     Resp 09/05/18 1315 18     Temp 09/05/18 1315 97.7 F (36.5 C)     Temp Source 09/05/18 1315 Oral     SpO2 09/05/18 1315 96 %     Weight 09/05/18 1316 257 lb (116.6 kg)     Height 09/05/18 1316 5\' 6"  (1.676 m)     Head Circumference --      Peak Flow --      Pain Score 09/05/18 1316 0     Pain Loc --      Pain Edu? --      Excl. in [place]? --    No data found.  Updated Vital Signs BP (!) 152/88 (BP Location: Right Arm)   Pulse  73   Temp 97.7 F (36.5 C) (Oral)   Resp 18   Ht 5\' 6"  (1.676 m)   Wt 116.6 kg   SpO2 96%   BMI 41.48 kg/m   Visual Acuity Right Eye Distance:   Left Eye Distance:   Bilateral Distance:    Right Eye Near:   Left Eye Near:    Bilateral Near:     Physical Exam Nursing notes and Vital Signs reviewed. Appearance:  Patient appears stated age, and in no acute distress Eyes:  Pupils are equal, round, and reactive to light and accomodation.  Extraocular movement is intact.  Conjunctivae are not inflamed  Ears:  Canals normal.  Tympanic membranes normal.  Nose:  Mildly congested turbinates.  No sinus tenderness.    Pharynx:  Normal Neck:  Supple.  Enlarged posterior/lateral nodes are palpated bilaterally, tender to palpation on the left.   Lungs:  Course breath sounds.  Breath sounds are equal.  Moving air well. Heart:  Regular rate and rhythm without murmurs, rubs, or gallops.  Abdomen:  Nontender without masses or hepatosplenomegaly.  Bowel sounds are present.  No CVA or flank tenderness.  Extremities:  No edema.  Skin:  No rash present.    UC Treatments / Results  Labs (all labs ordered are listed, but only abnormal results are displayed) Labs Reviewed - No data to display  EKG None  Radiology No results found.  Procedures Procedures (including critical care time)  Medications Ordered in UC Medications - No data to display  Initial Impression / Assessment and Plan / UC Course  I have reviewed the triage vital signs and the nursing notes.  Pertinent labs & imaging results that were available during my care of the patient were reviewed by me and considered in my medical decision making (see chart for details).    Begin Z-pak and prednisone burst. Prescription written for Benzonatate (Tessalon) to take at bedtime for night-time cough.  Followup with Family Doctor if not improved in one week.    Final Clinical Impressions(s) / UC Diagnoses   Final diagnoses:    Bronchitis with bronchospasm     Discharge Instructions     Take plain guaifenesin (1200mg  extended release tabs such as Mucinex) twice daily, with plenty of water, for cough and congestion.   Get adequate rest.  May use Afrin nasal spray (or generic oxymetazoline) each morning for about 5 days and then discontinue.  Also recommend using saline nasal spray several times daily and saline nasal irrigation (AYR is a common brand).  Use Flonase nasal spray each morning after using Afrin nasal spray and saline nasal irrigation. Try warm salt water gargles for sore throat.  Stop all antihistamines for now, and other non-prescription cough/cold preparations.        ED Prescriptions    Medication Sig Dispense Auth. Provider   azithromycin (ZITHROMAX) 250 MG tablet Take 1 tablet (250 mg total) by mouth daily. Take first 2 tablets together, then 1 every day until finished. 6 tablet Lattie Haw, MD   benzonatate (TESSALON) 200 MG capsule Take one cap by mouth at bedtime as needed for cough.  May repeat in 4 to 6 hours 15 capsule Lattie Haw, MD   predniSONE (DELTASONE) 20 MG tablet Take one tab by mouth twice daily for 4 days, then one daily. Take with food. 12 tablet Lattie Haw, MD        Lattie Haw, MD 09/07/18 1007

## 2018-09-05 NOTE — ED Triage Notes (Signed)
Pt stated that cough started January 7, and lasted a few days, and got better.  About 1 week ago, started coughing again, and is worse than before.  Thick productive cough, wheezes at times, and sinus pressure.

## 2018-09-05 NOTE — Discharge Instructions (Addendum)
Take plain guaifenesin (1200mg extended release tabs such as Mucinex) twice daily, with plenty of water, for cough and congestion.  Get adequate rest.   °May use Afrin nasal spray (or generic oxymetazoline) each morning for about 5 days and then discontinue.  Also recommend using saline nasal spray several times daily and saline nasal irrigation (AYR is a common brand).  Use Flonase nasal spray each morning after using Afrin nasal spray and saline nasal irrigation. °Try warm salt water gargles for sore throat.  °Stop all antihistamines for now, and other non-prescription cough/cold preparations. °  °  °

## 2018-10-05 DIAGNOSIS — J208 Acute bronchitis due to other specified organisms: Secondary | ICD-10-CM | POA: Diagnosis not present

## 2018-10-05 DIAGNOSIS — J40 Bronchitis, not specified as acute or chronic: Secondary | ICD-10-CM | POA: Diagnosis not present

## 2018-10-05 DIAGNOSIS — B9689 Other specified bacterial agents as the cause of diseases classified elsewhere: Secondary | ICD-10-CM | POA: Diagnosis not present

## 2018-10-19 MED FILL — FLUoxetine HCL 20 MG CAPS: 20 | 30 days supply | Qty: 90 | Fill #2

## 2018-10-19 MED FILL — HYDROCHLOROTHIAZIDE 25 MG T: 25 | 30 days supply | Qty: 30 | Fill #1

## 2018-10-19 MED FILL — ARIPiprazole 5 MG TABS: 5 | 30 days supply | Qty: 30 | Fill #1

## 2018-10-19 MED FILL — AMLODIPINE BESYLATE 5 MG TA: 5 | 30 days supply | Qty: 30 | Fill #1

## 2018-10-19 MED FILL — LOSARTAN POTASSIUM 100 MG T: 100 | 30 days supply | Qty: 30 | Fill #1

## 2018-12-12 MED FILL — AMLODIPINE BESYLATE 5 MG TA: 5 | 30 days supply | Qty: 30 | Fill #2

## 2018-12-12 MED FILL — LOSARTAN POTASSIUM 100 MG T: 100 | 30 days supply | Qty: 30 | Fill #2

## 2018-12-12 MED FILL — FLUoxetine HCL 20 MG CAPS: 20 | 30 days supply | Qty: 90 | Fill #0

## 2018-12-12 MED FILL — HYDROCHLOROTHIAZIDE 25 MG T: 25 | 30 days supply | Qty: 30 | Fill #2

## 2019-01-03 DIAGNOSIS — I10 Essential (primary) hypertension: Secondary | ICD-10-CM | POA: Diagnosis not present

## 2019-01-03 DIAGNOSIS — Z79899 Other long term (current) drug therapy: Secondary | ICD-10-CM | POA: Diagnosis not present

## 2019-01-03 DIAGNOSIS — R7301 Impaired fasting glucose: Secondary | ICD-10-CM | POA: Diagnosis not present

## 2019-02-02 MED FILL — HYDROCHLOROTHIAZIDE 25 MG T: 25 | 30 days supply | Qty: 30 | Fill #3

## 2019-02-02 MED FILL — LOSARTAN POTASSIUM 100 MG T: 100 | 30 days supply | Qty: 30 | Fill #3

## 2019-02-02 MED FILL — AMLODIPINE BESYLATE 5 MG TA: 5 | 30 days supply | Qty: 30 | Fill #3

## 2019-02-02 MED FILL — FLUoxetine HCL 20 MG CAPS: 20 | 30 days supply | Qty: 90 | Fill #1

## 2019-03-19 MED FILL — FLUoxetine HCL 20 MG CAPS: 20 | 30 days supply | Qty: 90 | Fill #2

## 2019-03-20 MED FILL — AMLODIPINE BESYLATE 5 MG TA: 5 | 30 days supply | Qty: 30 | Fill #0

## 2019-03-20 MED FILL — LOSARTAN POTASSIUM 100 MG T: 100 | 30 days supply | Qty: 30 | Fill #0

## 2019-03-20 MED FILL — HYDROCHLOROTHIAZIDE 25 MG T: 25 | 30 days supply | Qty: 30 | Fill #0

## 2019-04-27 MED FILL — LOSARTAN POTASSIUM 100 MG T: 100 | 30 days supply | Qty: 30 | Fill #1

## 2019-04-27 MED FILL — HYDROCHLOROTHIAZIDE 25 MG T: 25 | 30 days supply | Qty: 30 | Fill #1

## 2019-04-27 MED FILL — AMLODIPINE BESYLATE 5 MG TA: 5 | 30 days supply | Qty: 30 | Fill #1

## 2019-05-29 MED FILL — FLUARIX QUADRIVALENT 0.5 ML: 0.5 | 1 days supply | Qty: 1 | Fill #0

## 2019-06-18 MED FILL — HYDROCHLOROTHIAZIDE 25 MG T: 25 | 30 days supply | Qty: 30 | Fill #2

## 2019-06-18 MED FILL — LOSARTAN POTASSIUM 100 MG T: 100 | 30 days supply | Qty: 30 | Fill #2

## 2019-06-18 MED FILL — AMLODIPINE BESYLATE 5 MG TA: 5 | 30 days supply | Qty: 30 | Fill #2

## 2019-07-19 DIAGNOSIS — H524 Presbyopia: Secondary | ICD-10-CM | POA: Diagnosis not present

## 2019-08-05 ENCOUNTER — Other Ambulatory Visit: Payer: Self-pay

## 2019-08-05 ENCOUNTER — Encounter: Payer: Self-pay | Admitting: Emergency Medicine

## 2019-08-05 ENCOUNTER — Emergency Department (INDEPENDENT_AMBULATORY_CARE_PROVIDER_SITE_OTHER)
Admission: EM | Admit: 2019-08-05 | Discharge: 2019-08-05 | Disposition: A | Discharge status: Home / Self Care | Payer: 59 | Source: Home / Self Care | Attending: Emergency Medicine | Admitting: Emergency Medicine

## 2019-08-05 ENCOUNTER — Emergency Department (INDEPENDENT_AMBULATORY_CARE_PROVIDER_SITE_OTHER): Payer: 59

## 2019-08-05 DIAGNOSIS — R519 Headache, unspecified: Secondary | ICD-10-CM

## 2019-08-05 DIAGNOSIS — R05 Cough: Secondary | ICD-10-CM | POA: Diagnosis not present

## 2019-08-05 DIAGNOSIS — R059 Cough, unspecified: Secondary | ICD-10-CM

## 2019-08-05 MED ORDER — BENZONATATE 100 MG PO CAPS: 100.0000 mg | ORAL_CAPSULE | Freq: Three times a day (TID) | ORAL | 0 refills | Status: AC | PRN

## 2019-08-05 MED ORDER — PREDNISONE 20 MG PO TABS: ORAL_TABLET | ORAL | 0 refills | Status: AC

## 2019-08-05 NOTE — ED Provider Notes (Signed)
[facility]    CSN: [id number] Arrival date & time: 08/05/19  [id number]      History   Chief Complaint Chief Complaint  Patient presents with  . Cough    HPI Jeanne Brooks is a 63 y.o. female.  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  She denies having a fever.  Her cough has become progressively worse.  It is occasionally productive.  She has not had loss of taste or smell.  She feels extremely fatigued.  She has not had chest pain or shortness of breath.  She denies Covid exposure.  She has had 1 loose stool. HPI  Past Medical History:  Diagnosis Date  . Arthritis   . Depression   . Hypertension     Patient Active Problem List   Diagnosis Date Noted  . 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 HAND SURGERY     LT hand  . MINI-LAPAROTOMY    . TENDON RELEASE    . 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 TUBAL LIGATION    . WISDOM TOOTH EXTRACTION      OB History   No obstetric history on file.      Home Medications    Prior to Admission medications   Medication Sig Start Date End Date Taking? Authorizing Provider  amLODipine (NORVASC) 5 MG tablet Take 5 mg by mouth every morning.    [provider]  ARIPiprazole (ABILIFY) 5 MG tablet  08/25/18   [provider]  azithromycin (ZITHROMAX) 250 MG tablet Take 1 tablet (250 mg total) by mouth daily. Take first 2 tablets together, then 1 every day until finished. 09/05/18   Lattie HawBeese, Stephen A, MD  benzonatate (TESSALON) 100 MG capsule Take 1-2 capsules (100-200 mg total) by mouth 3 (three) times daily as needed for cough. 08/05/19   Collene Gobbleaub, Teanna Elem A, MD  FLUoxetine (PROZAC) 40 MG capsule Take 40 mg by mouth every morning.     [provider]  hydrochlorothiazide (HYDRODIURIL) 25 MG tablet  Take 25 mg by mouth every morning.     [provider]  losartan (COZAAR) 100 MG tablet Take 100 mg by mouth every morning.     [provider]  predniSONE (DELTASONE) 20 MG tablet Take 1 twice a day for 4 days then 1 a day 08/05/19   Collene Gobbleaub, Lashayla Armes A, MD  zolpidem (AMBIEN) 10 MG tablet Take 10 mg by mouth at bedtime as needed for sleep.    [provider]    Family History Family History  Problem Relation Age of Onset  . Cancer Mother        breast  . Hypertension Mother   . Coronary artery disease Father   . COPD Father   . Hypertension Father     Social History Social History   Tobacco Use  . Smoking status: Never Smoker  . Smokeless tobacco: Never Used  Substance Use Topics  . Alcohol use: Yes    Comment: occasional   . Drug use: No     Allergies   Epinephrine, Amoxicillin-pot clavulanate, and Codeine   Review of Systems Review of Systems  Constitutional: Positive for activity change, chills and fatigue.  HENT: Positive for congestion. Negative for sore throat.   Eyes: Negative.  Respiratory: Positive for cough. Negative for shortness of breath.   Cardiovascular: Negative for chest pain.  Gastrointestinal: Positive for diarrhea.  Endocrine: Negative.   Genitourinary: Negative.   Musculoskeletal: Negative.   Neurological: Negative.      Physical Exam Triage Vital Signs ED Triage Vitals  Enc Vitals Group     BP 08/05/19 0909 (!) 146/94     Pulse Rate 08/05/19 0909 80     Resp --      Temp 08/05/19 0909 98.5 F (36.9 C)     Temp Source 08/05/19 0909 Oral     SpO2 08/05/19 0909 97 %     Weight 08/05/19 0910 228 lb (103.4 kg)     Height 08/05/19 0910 5\' 6"  (1.676 m)     Head Circumference --      Peak Flow --      Pain Score 08/05/19 0910 0     Pain Loc --      Pain Edu? --      Excl. in [place]? --    No data found.  Updated Vital Signs BP (!) 146/94 (BP Location: Right Arm)   Pulse 80   Temp 98.5 F (36.9 C) (Oral)    Resp 14   Ht 5\' 6"  (1.676 m)   Wt 103.4 kg   SpO2 97%   BMI 36.80 kg/m Respiratory rate of 14  Visual Acuity Right Eye Distance:   Left Eye Distance:   Bilateral Distance:    Right Eye Near:   Left Eye Near:    Bilateral Near:     Physical Exam Constitutional:      Comments: Frequent episodes of cough  HENT:     Head: Normocephalic.     Right Ear: Tympanic membrane normal.     Left Ear: Tympanic membrane normal.     Nose: Nose normal.     Mouth/Throat:     Pharynx: Oropharynx is clear.  Cardiovascular:     Rate and Rhythm: Normal rate and regular rhythm.     Heart sounds: Normal heart sounds.  Pulmonary:     Effort: Pulmonary effort is normal.     Breath sounds: Normal breath sounds. No wheezing.  Abdominal:     General: Abdomen is flat.     Palpations: Abdomen is soft.  Musculoskeletal:     Cervical back: Normal range of motion.  Skin:    General: Skin is warm and dry.  Neurological:     General: No focal deficit present.     Mental Status: She is alert.  Psychiatric:        Mood and Affect: Mood normal.        Behavior: Behavior normal.      UC Treatments / Results  Labs (all labs ordered are listed, but only abnormal results are displayed) Labs Reviewed  NOVEL CORONAVIRUS, NAA    EKG   Radiology DG Chest 2 View  Result Date: 08/05/2019 CLINICAL DATA:  Nonproductive cough with diarrhea and headache. EXAM: CHEST - 2 VIEW COMPARISON:  04/25/2014 FINDINGS: The lungs are clear without focal pneumonia, edema, pneumothorax or pleural effusion. The cardiopericardial silhouette is within normal limits for size. The visualized bony structures of the thorax are intact. IMPRESSION: No active cardiopulmonary disease. Electronically Signed   By: Misty Stanley M.D.   On: 08/05/2019 10:19    Procedures Procedures (including critical care time)  Medications Ordered in UC Medications - No data to display  Initial Impression / Assessment and Plan / UC  Course    I have reviewed the triage vital signs and the nursing notes. She has a history of bronchitis.  She is a non-smoker.  This definitely could be Covid.  Unable to do rapid testing today so PCR testing was collected.  Will check chest x-ray to rule out pneumonia. Pertinent labs & imaging results that were available during my care of the patient were reviewed by me and considered in my medical decision making (see chart for details). Chest x-ray showed no pneumonic infiltrate.  She has required prednisone in the past to help with her cough will treat with prednisone in a taper dose along with Tessalon Perles.  She did have blood work done in May with a normal hemoglobin A1c normal hemoglobin 14.6 normal comprehensive medical panel.  She will follow-up with her family doctor.  I did put her on Pepcid in case there was a component of reflux and she is taken out of work until her Covid results return     Final Clinical Impressions(s) / UC Diagnoses   Final diagnoses:  Cough     Discharge Instructions     Be out of work until your Covid test is back. Take Tessalon Perles for cough. Take Pepcid 20 mg a day in case some of your cough is secondary to reflux If you develop chest pain or shortness of breath please be seen in the emergency room. Follow-up with your family doctor if no better in 2 to 3 days. You are taking losartan which in some patients has been associated with cough please discuss this with your family doctor.    ED Prescriptions    Medication Sig Dispense Auth. Provider   benzonatate (TESSALON) 100 MG capsule Take 1-2 capsules (100-200 mg total) by mouth 3 (three) times daily as needed for cough. 40 capsule Collene Gobble, MD   predniSONE (DELTASONE) 20 MG tablet Take 1 twice a day for 4 days then 1 a day 12 tablet Keah Lamba, Maylon Peppers, MD     PDMP not reviewed this encounter.   Collene Gobble, MD 08/05/19 1201

## 2019-08-05 NOTE — ED Triage Notes (Signed)
Patient c/o cold sx's x 1 week, HA, productive cough, fatigue, afebrile, runny nose, no ear pain, possible bronchitis.

## 2019-08-05 NOTE — Discharge Instructions (Addendum)
Be out of work until your Covid test is back. Take Tessalon Perles for cough. Take Pepcid 20 mg a day in case some of your cough is secondary to reflux If you develop chest pain or shortness of breath please be seen in the emergency room. Follow-up with your family doctor if no better in 2 to 3 days. You are taking losartan which in some patients has been associated with cough please discuss this with your family doctor.

## 2019-08-06 LAB — NOVEL CORONAVIRUS, NAA: SARS-CoV-2, NAA: NOT DETECTED

## 2019-08-09 MED FILL — AMLODIPINE BESYLATE 5 MG TA: 5 | 30 days supply | Qty: 30 | Fill #3

## 2019-08-09 MED FILL — LOSARTAN POTASSIUM 100 MG T: 100 | 30 days supply | Qty: 30 | Fill #3

## 2019-08-09 MED FILL — HYDROCHLOROTHIAZIDE 25 MG T: 25 | 30 days supply | Qty: 30 | Fill #3

## 2019-08-23 MED FILL — CEFDINIR 300 MG CAPSULE: 300 | 10 days supply | Qty: 20 | Fill #0

## 2019-08-23 MED FILL — AZELASTINE HCL 137 MCG SPRY: 0.1 | 13 days supply | Qty: 30 | Fill #0

## 2019-08-23 MED FILL — METHYLPREDNISOLONE 4 MG TBP: 4 | 6 days supply | Qty: 21 | Fill #0

## 2019-09-19 MED FILL — LOSARTAN POTASSIUM 100 MG T: 100 | 30 days supply | Qty: 30 | Fill #4

## 2019-09-19 MED FILL — HYDROCHLOROTHIAZIDE 25 MG T: 25 | 30 days supply | Qty: 30 | Fill #4

## 2019-09-19 MED FILL — AMLODIPINE BESYLATE 5 MG TA: 5 | 30 days supply | Qty: 30 | Fill #4

## 2019-10-31 MED FILL — AMLODIPINE BESYLATE 5 MG TA: 5 | 30 days supply | Qty: 30 | Fill #5

## 2019-10-31 MED FILL — HYDROCHLOROTHIAZIDE 25 MG T: 25 | 30 days supply | Qty: 30 | Fill #5

## 2019-10-31 MED FILL — LOSARTAN POTASSIUM 100 MG T: 100 | 30 days supply | Qty: 30 | Fill #5

## 2019-12-08 DIAGNOSIS — Z1231 Encounter for screening mammogram for malignant neoplasm of breast: Secondary | ICD-10-CM | POA: Diagnosis not present

## 2019-12-11 MED FILL — AMLODIPINE BESYLATE 5 MG TA: 5 | 30 days supply | Qty: 30 | Fill #0

## 2019-12-11 MED FILL — HYDROCHLOROTHIAZIDE 25 MG T: 25 | 30 days supply | Qty: 30 | Fill #0

## 2019-12-11 MED FILL — LOSARTAN POTASSIUM 100 MG T: 100 | 30 days supply | Qty: 30 | Fill #0

## 2019-12-20 DIAGNOSIS — M47812 Spondylosis without myelopathy or radiculopathy, cervical region: Secondary | ICD-10-CM | POA: Diagnosis not present

## 2019-12-25 MED FILL — ATORVASTATIN CALCIUM 10 MG: 10 | 28 days supply | Qty: 12 | Fill #0

## 2020-01-21 MED FILL — ATORVASTATIN CALCIUM 10 MG: 10 | 28 days supply | Qty: 12 | Fill #1

## 2020-02-19 MED FILL — ATORVASTATIN CALCIUM 10 MG: 10 | 28 days supply | Qty: 12 | Fill #2

## 2020-02-19 MED FILL — LOSARTAN POTASSIUM 100 MG T: 100 | 30 days supply | Qty: 30 | Fill #1

## 2020-02-19 MED FILL — HYDROCHLOROTHIAZIDE 25 MG T: 25 | 30 days supply | Qty: 30 | Fill #1

## 2020-02-19 MED FILL — AMLODIPINE BESYLATE 5 MG TA: 5 | 30 days supply | Qty: 30 | Fill #1

## 2020-03-20 DIAGNOSIS — Z23 Encounter for immunization: Secondary | ICD-10-CM | POA: Diagnosis not present

## 2020-03-20 DIAGNOSIS — E782 Mixed hyperlipidemia: Secondary | ICD-10-CM | POA: Diagnosis not present

## 2020-03-24 MED FILL — ATORVASTATIN CALCIUM 10 MG: 10 | 28 days supply | Qty: 12 | Fill #0

## 2020-03-26 DIAGNOSIS — M65849 Other synovitis and tenosynovitis, unspecified hand: Secondary | ICD-10-CM | POA: Diagnosis not present

## 2020-03-26 DIAGNOSIS — M65341 Trigger finger, right ring finger: Secondary | ICD-10-CM | POA: Diagnosis not present

## 2020-03-26 DIAGNOSIS — M65332 Trigger finger, left middle finger: Secondary | ICD-10-CM | POA: Diagnosis not present

## 2020-04-02 MED FILL — LOSARTAN POTASSIUM 100 MG T: 100 | 30 days supply | Qty: 30 | Fill #2

## 2020-04-02 MED FILL — AMLODIPINE BESYLATE 5 MG TA: 5 | 30 days supply | Qty: 30 | Fill #2

## 2020-04-02 MED FILL — HYDROCHLOROTHIAZIDE 25 MG T: 25 | 30 days supply | Qty: 30 | Fill #2

## 2020-04-03 MED FILL — ATORVASTATIN CALCIUM 10 MG: 10 | 30 days supply | Qty: 30 | Fill #0

## 2020-10-15 LAB — COLOGUARD: COLOGUARD: POSITIVE — AB

## 2021-05-18 IMAGING — DX DG CHEST 2V
2 series · 2 of 2 positions shown · non-contrast
Comparison: 04/25/2014

CLINICAL DATA: Nonproductive cough with diarrhea and headache.

EXAM:
CHEST - 2 VIEW

[chest pa]
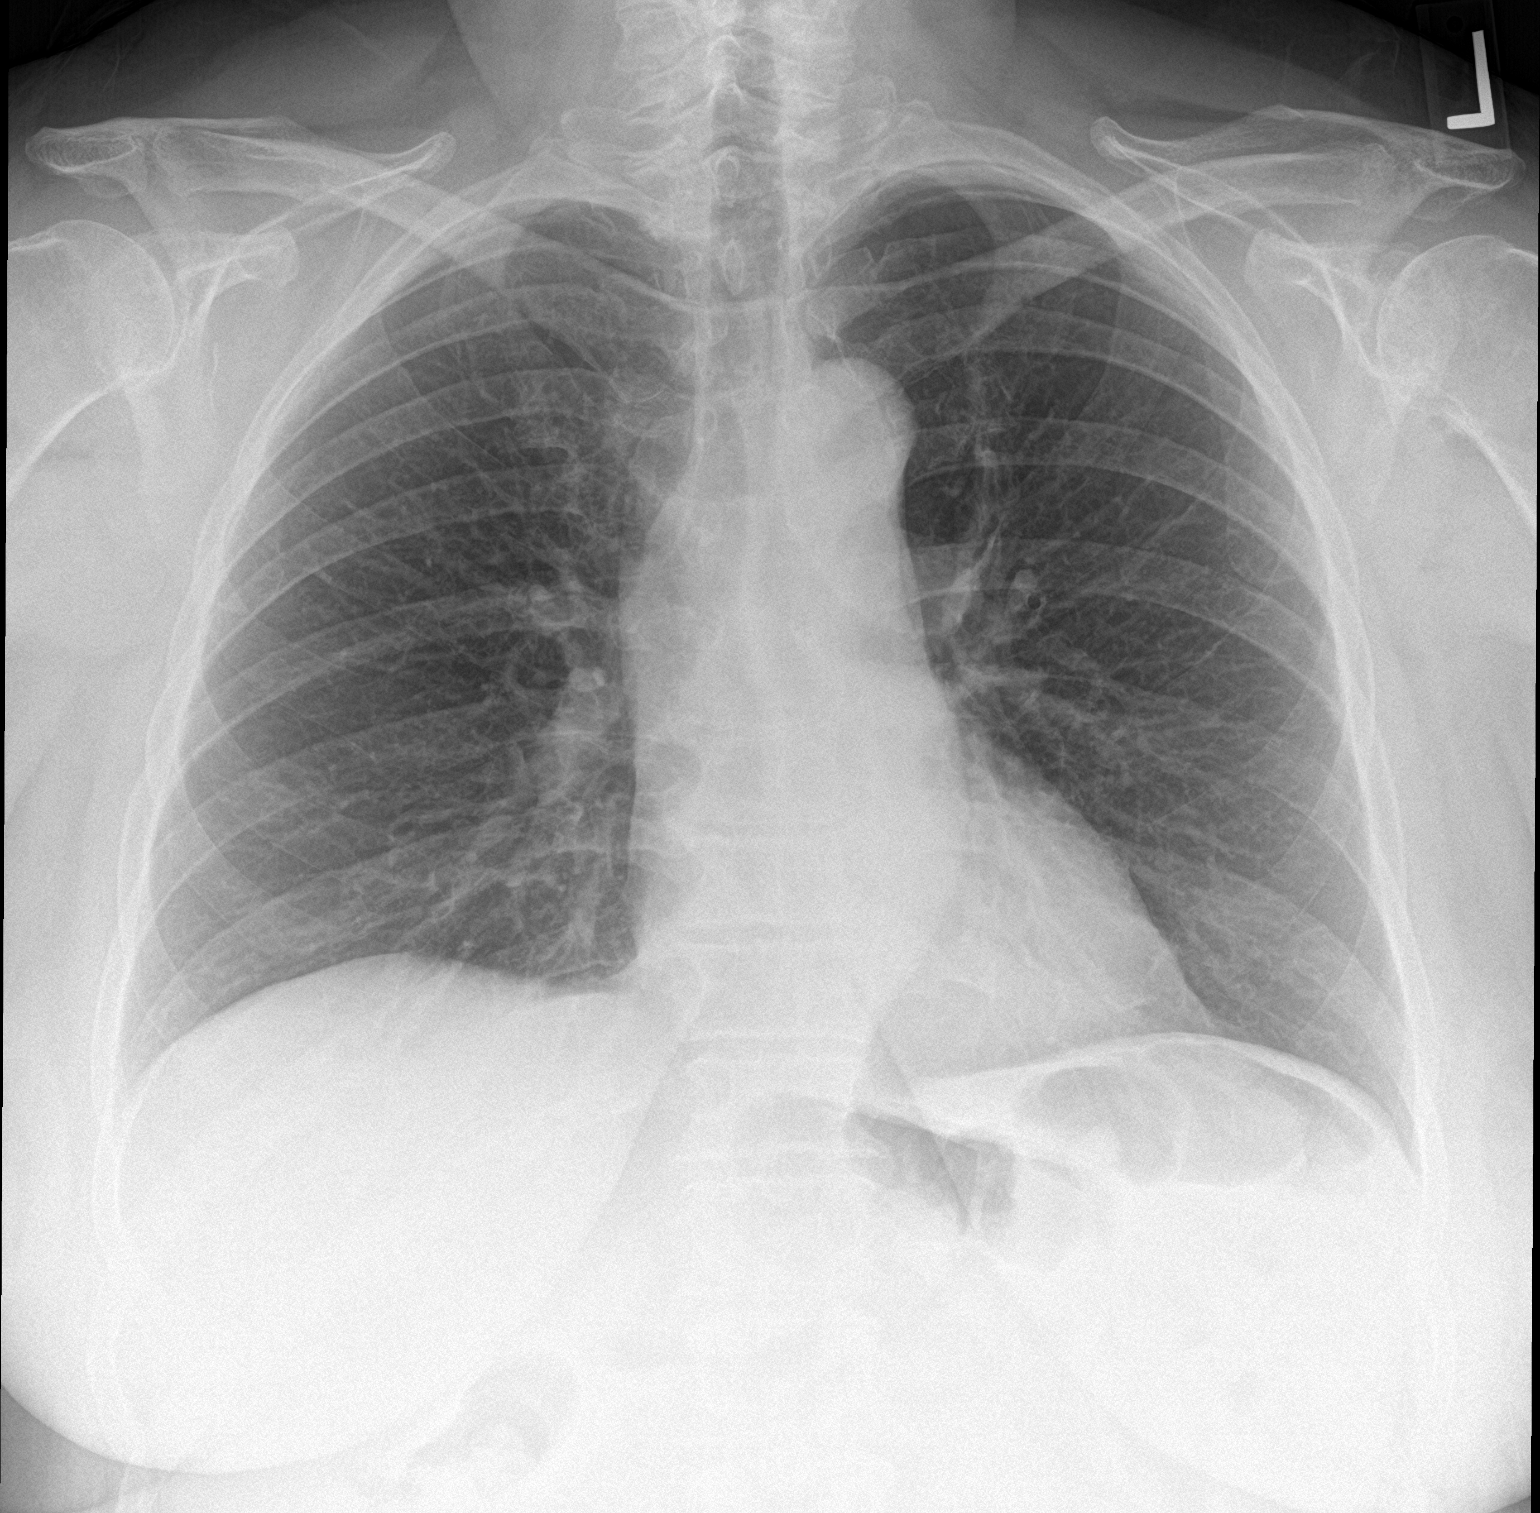

[chest lat]
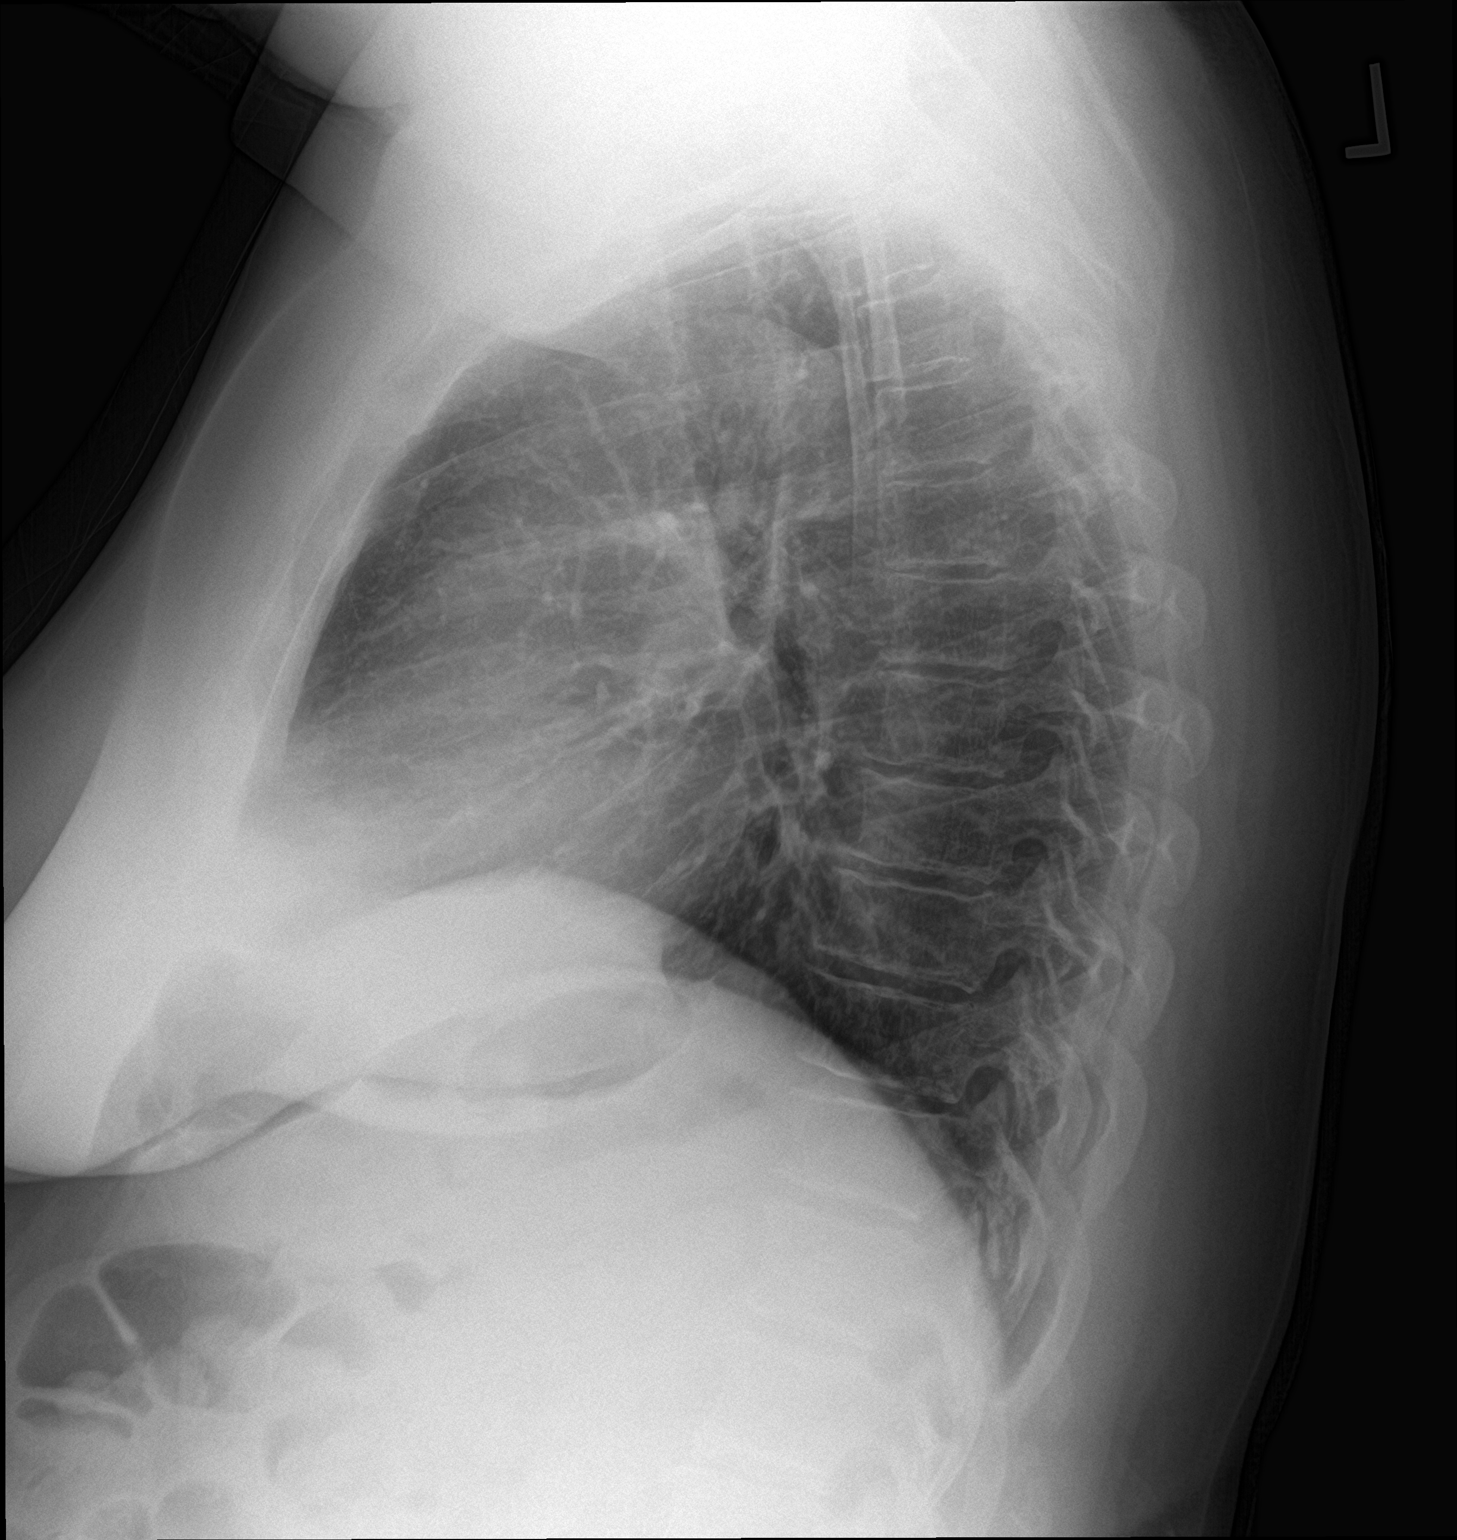

[2 of 2 positions shown; findings below may reference images not displayed]

FINDINGS: The lungs are clear without focal pneumonia, edema, pneumothorax or
pleural effusion. The cardiopericardial silhouette is within normal
limits for size. The visualized bony structures of the thorax are
intact.
IMPRESSION: No active cardiopulmonary disease.

## 2024-06-07 ENCOUNTER — Telehealth: Payer: Self-pay | Admitting: Orthopaedic Surgery

## 2024-06-07 NOTE — Telephone Encounter (Signed)
 Patient aware that doc at [facility] not here Gave her that number for Dr. Duwayne

## 2024-06-07 NOTE — Telephone Encounter (Signed)
 Pt called and stated she had hip replacement several years ago with [facility]\ and pt has moved and need antibiotics sent for her upcoming dental appt. Please send to [facility]. Pt phone number is [phone]

## 2024-06-11 ENCOUNTER — Encounter: Payer: Self-pay | Admitting: Radiology
